# Patient Record
Sex: Male | Born: 1935
Health system: Southern US, Community
[De-identification: ages and names within clinical notes are randomized; demographics above are authoritative.]

## PROBLEM LIST (undated history)

## (undated) DIAGNOSIS — M545 Low back pain, unspecified: Secondary | ICD-10-CM

## (undated) DIAGNOSIS — R001 Bradycardia, unspecified: Secondary | ICD-10-CM

## (undated) DIAGNOSIS — K219 Gastro-esophageal reflux disease without esophagitis: Secondary | ICD-10-CM

## (undated) DIAGNOSIS — R0602 Shortness of breath: Secondary | ICD-10-CM

## (undated) DIAGNOSIS — G8929 Other chronic pain: Secondary | ICD-10-CM

## (undated) DIAGNOSIS — H409 Unspecified glaucoma: Secondary | ICD-10-CM

## (undated) DIAGNOSIS — I10 Essential (primary) hypertension: Secondary | ICD-10-CM

## (undated) DIAGNOSIS — T7840XA Allergy, unspecified, initial encounter: Secondary | ICD-10-CM

## (undated) DIAGNOSIS — N189 Chronic kidney disease, unspecified: Secondary | ICD-10-CM

## (undated) DIAGNOSIS — E785 Hyperlipidemia, unspecified: Secondary | ICD-10-CM

## (undated) DIAGNOSIS — M1990 Unspecified osteoarthritis, unspecified site: Secondary | ICD-10-CM

## (undated) HISTORY — DX: Chronic kidney disease, unspecified: N18.9

## (undated) HISTORY — DX: Essential (primary) hypertension: I10

## (undated) HISTORY — DX: Low back pain, unspecified: M54.50

## (undated) HISTORY — DX: Unspecified osteoarthritis, unspecified site: M19.90

## (undated) HISTORY — DX: Shortness of breath: R06.02

## (undated) HISTORY — DX: Bradycardia, unspecified: R00.1

## (undated) HISTORY — DX: Hyperlipidemia, unspecified: E78.5

## (undated) HISTORY — DX: Low back pain: M54.5

## (undated) HISTORY — DX: Unspecified glaucoma: H40.9

## (undated) HISTORY — DX: Gastro-esophageal reflux disease without esophagitis: K21.9

## (undated) HISTORY — DX: Allergy, unspecified, initial encounter: T78.40XA

## (undated) HISTORY — DX: Other chronic pain: G89.29

---

## 2008-10-10 HISTORY — PX: CATARACT EXTRACTION: SUR2

## 2015-12-10 ENCOUNTER — Ambulatory Visit (INDEPENDENT_AMBULATORY_CARE_PROVIDER_SITE_OTHER): Payer: Medicare Other | Admitting: Family Medicine

## 2015-12-10 ENCOUNTER — Encounter: Payer: Self-pay | Admitting: Family Medicine

## 2015-12-10 VITALS — BP 122/75 | HR 83 | Temp 97.9°F | Resp 17 | Ht 67.0 in | Wt 163.3 lb

## 2015-12-10 DIAGNOSIS — I10 Essential (primary) hypertension: Secondary | ICD-10-CM | POA: Diagnosis not present

## 2015-12-10 DIAGNOSIS — E785 Hyperlipidemia, unspecified: Secondary | ICD-10-CM

## 2015-12-10 DIAGNOSIS — Z7189 Other specified counseling: Secondary | ICD-10-CM

## 2015-12-10 DIAGNOSIS — Z7689 Persons encountering health services in other specified circumstances: Secondary | ICD-10-CM

## 2015-12-10 NOTE — Progress Notes (Signed)
Name: Brett Gill   MRN: [id number]    DOB: Jan 26, 1935   Date:12/10/2015       Progress Note  Subjective  Chief Complaint  Chief Complaint  Patient presents with  . Establish Care    NP    Hyperlipidemia This is a chronic problem. Condition status: Lab results from previous PCP not available. Pertinent negatives include no chest pain, leg pain, myalgias or shortness of breath. Current antihyperlipidemic treatment includes statins.  Hypertension This is a chronic problem. The problem is controlled. Pertinent negatives include no chest pain, headaches, palpitations or shortness of breath. Past treatments include ACE inhibitors. There are no compliance problems.  There is no history of kidney disease, CAD/MI or CVA.    Pt. Is here to establish care. Former patient of Dr. Thana Ates. Currently being seen at the [facility], records not available for review.    Past Medical History  Diagnosis Date  . Allergy   . Hypertension   . Hyperlipidemia   . Chronic low back pain     Present for 8-9 years  . GERD (gastroesophageal reflux disease)   . Arthritis   . Cataract   . Glaucoma     Follows at the [facility]    Past Surgical History  Procedure Laterality Date  . Cataract extraction Bilateral 2010    Family History  Problem Relation Age of Onset  . Alzheimer's disease Mother   . Alzheimer's disease Father   . Heart disease Father     Social History   Social History  . Marital Status: Single    Spouse Name: N/A  . Number of Children: N/A  . Years of Education: N/A   Occupational History  . Not on file.   Social History Main Topics  . Smoking status: Never Smoker   . Smokeless tobacco: Never Used  . Alcohol Use: No  . Drug Use: No  . Sexual Activity: No   Other Topics Concern  . Not on file   Social History Narrative  . No narrative on file     Current outpatient prescriptions:  .  brimonidine (ALPHAGAN) 0.2 % ophthalmic solution, Place 1 drop into both eyes 3  (three) times daily., Disp: , Rfl:  .  Cyanocobalamin (VITAMIN B 12 PO), Take 1 tablet by mouth daily., Disp: , Rfl:  .  dorzolamide-timolol (COSOPT) 22.3-6.8 MG/ML ophthalmic solution, , Disp: , Rfl:  .  latanoprost (XALATAN) 0.005 % ophthalmic solution, , Disp: , Rfl:  .  lisinopril (PRINIVIL,ZESTRIL) 20 MG tablet, Take 1 tablet by mouth daily., Disp: , Rfl:  .  meloxicam (MOBIC) 15 MG tablet, Take 1 tablet by mouth daily., Disp: , Rfl:  .  omeprazole (PRILOSEC) 20 MG capsule, Take 2 capsules by mouth daily., Disp: , Rfl:  .  oxyCODONE-acetaminophen (PERCOCET/ROXICET) 5-325 MG tablet, Take 1 tablet by mouth 3 (three) times daily as needed for severe pain., Disp: , Rfl:  .  pravastatin (PRAVACHOL) 80 MG tablet, Take 1 tablet by mouth daily., Disp: , Rfl:   No Known Allergies   Review of Systems  Respiratory: Negative for shortness of breath.   Cardiovascular: Negative for chest pain and palpitations.  Musculoskeletal: Negative for myalgias.  Neurological: Negative for headaches.     Objective  Filed Vitals:   12/10/15 1102  BP: 122/75  Pulse: 83  Temp: 97.9 F (36.6 C)  TempSrc: Oral  Resp: 17  Height: 5\' 7"  (1.702 m)  Weight: 163 lb 4.8 oz (74.072 kg)  SpO2: 95%  Physical Exam  Constitutional: He is oriented to person, place, and time and well-developed, well-nourished, and in no distress.  HENT:  Head: Normocephalic and atraumatic.  Cardiovascular: Normal rate and regular rhythm.   Pulmonary/Chest: Effort normal and breath sounds normal.  Neurological: He is alert and oriented to person, place, and time.  Nursing note and vitals reviewed.       Assessment & Plan  1. Essential hypertension Blood pressure well controlled. Continue lisinopril  2. Hyperlipidemia We will obtain FLP at patient's subsequent visit.  3. Encounter to establish care with new doctor We will request records from patient's physician at the [facility].   Brett Gill Asad A. Faylene Kurtz  [facility] 12/10/2015 11:39 AM

## 2015-12-11 DIAGNOSIS — Z7689 Persons encountering health services in other specified circumstances: Secondary | ICD-10-CM | POA: Insufficient documentation

## 2015-12-21 ENCOUNTER — Encounter: Payer: Self-pay | Admitting: Family Medicine

## 2015-12-25 ENCOUNTER — Ambulatory Visit (INDEPENDENT_AMBULATORY_CARE_PROVIDER_SITE_OTHER): Payer: Medicare Other | Admitting: Family Medicine

## 2015-12-25 ENCOUNTER — Encounter: Payer: Self-pay | Admitting: Family Medicine

## 2015-12-25 VITALS — BP 120/66 | HR 60 | Temp 98.6°F | Resp 16 | Ht 67.0 in | Wt 163.2 lb

## 2015-12-25 DIAGNOSIS — E559 Vitamin D deficiency, unspecified: Secondary | ICD-10-CM

## 2015-12-25 DIAGNOSIS — Z0000 Encounter for general adult medical examination without abnormal findings: Secondary | ICD-10-CM | POA: Diagnosis not present

## 2015-12-25 NOTE — Progress Notes (Signed)
Name: Brett Gill   MRN: [id number]    DOB: 1935/01/07   Date:12/25/2015       Progress Note  Subjective  Chief Complaint  Chief Complaint  Patient presents with  . Annual Exam    CPE    HPI  Pt. Is here for a Complete Physical Exam. He is doing well, no concerns today. He is only here because [facility] recommends getting a yearly physical.   Past Medical History  Diagnosis Date  . Allergy   . Hypertension   . Hyperlipidemia   . Chronic low back pain     Present for 8-9 years  . GERD (gastroesophageal reflux disease)   . Arthritis   . Cataract   . Glaucoma     Follows at the [facility]    Past Surgical History  Procedure Laterality Date  . Cataract extraction Bilateral 2010    Family History  Problem Relation Age of Onset  . Alzheimer's disease Mother   . Alzheimer's disease Father   . Heart disease Father     Social History   Social History  . Marital Status: Single    Spouse Name: N/A  . Number of Children: N/A  . Years of Education: N/A   Occupational History  . Not on file.   Social History Main Topics  . Smoking status: Never Smoker   . Smokeless tobacco: Never Used  . Alcohol Use: No  . Drug Use: No  . Sexual Activity: No   Other Topics Concern  . Not on file   Social History Narrative     Current outpatient prescriptions:  .  brimonidine (ALPHAGAN) 0.2 % ophthalmic solution, Place 1 drop into both eyes 3 (three) times daily., Disp: , Rfl:  .  Cyanocobalamin (VITAMIN B 12 PO), Take 1 tablet by mouth daily., Disp: , Rfl:  .  dorzolamide-timolol (COSOPT) 22.3-6.8 MG/ML ophthalmic solution, , Disp: , Rfl:  .  latanoprost (XALATAN) 0.005 % ophthalmic solution, , Disp: , Rfl:  .  lisinopril (PRINIVIL,ZESTRIL) 20 MG tablet, Take 1 tablet by mouth daily., Disp: , Rfl:  .  meloxicam (MOBIC) 15 MG tablet, Take 1 tablet by mouth daily., Disp: , Rfl:  .  omeprazole (PRILOSEC) 20 MG capsule, Take 2 capsules by mouth daily., Disp: , Rfl:  .   oxyCODONE-acetaminophen (PERCOCET/ROXICET) 5-325 MG tablet, Take 1 tablet by mouth 3 (three) times daily as needed for severe pain., Disp: , Rfl:  .  pravastatin (PRAVACHOL) 80 MG tablet, Take 1 tablet by mouth daily., Disp: , Rfl:   No Known Allergies   Review of Systems  Constitutional: Negative for fever, chills and malaise/fatigue.  HENT: Negative for congestion and sore throat.   Eyes: Negative for blurred vision (Has [place], seeing a doctor in [place]) and double vision.  Respiratory: Negative for cough and shortness of breath.   Cardiovascular: Negative for chest pain and leg swelling.  Gastrointestinal: Negative for heartburn, nausea and vomiting.  Genitourinary: Negative for dysuria, frequency and hematuria.  Musculoskeletal: Positive for back pain (back pain every now and then) and joint pain. Negative for myalgias.  Skin: Negative for rash.  Neurological: Negative for dizziness and headaches.  Psychiatric/Behavioral: Negative for depression. The patient is not nervous/anxious and does not have insomnia.       Objective  Filed Vitals:   12/25/15 1119  BP: 120/66  Pulse: 60  Temp: 98.6 F (37 C)  TempSrc: Oral  Resp: 16  Height: 5\' 7"  (1.702 m)  Weight:  163 lb 3.2 oz (74.027 kg)  SpO2: 96%    Physical Exam  Constitutional: He is oriented to person, place, and time and well-developed, well-nourished, and in no distress.  HENT:  Head: Normocephalic and atraumatic.  Mouth/Throat: No posterior oropharyngeal erythema.  Eyes: Pupils are equal, round, and reactive to light.  Cardiovascular: Normal rate and regular rhythm.   Murmur heard.  Systolic murmur is present with a grade of 2/6  Pulmonary/Chest: Effort normal and breath sounds normal.  Abdominal: Soft. Bowel sounds are normal.  Genitourinary:  Deferred on pt. Request.  Musculoskeletal: He exhibits no edema.  Neurological: He is alert and oriented to person, place, and time.  Skin: Skin is warm and dry.   Psychiatric: Mood, memory, affect and judgment normal.  Nursing note and vitals reviewed.    Assessment & Plan  1. Annual physical exam Age appropriate laboratory screenings. - CBC with Differential - Comprehensive Metabolic Panel (CMET) - Lipid Profile - TSH - Vitamin D (25 hydroxy) - PSA   Surya Schroeter Asad A. Faylene Kurtz [facility] 12/25/2015 11:37 AM

## 2015-12-28 DIAGNOSIS — Z0000 Encounter for general adult medical examination without abnormal findings: Secondary | ICD-10-CM | POA: Diagnosis not present

## 2015-12-29 LAB — CBC WITH DIFFERENTIAL/PLATELET
BASOS: 0 %
Basophils Absolute: 0 10*3/uL (ref 0.0–0.2)
EOS (ABSOLUTE): 0.1 10*3/uL (ref 0.0–0.4)
EOS: 2 %
HEMOGLOBIN: 14 g/dL (ref 12.6–17.7)
Hematocrit: 42.5 % (ref 37.5–51.0)
IMMATURE GRANS (ABS): 0 10*3/uL (ref 0.0–0.1)
IMMATURE GRANULOCYTES: 0 %
LYMPHS: 21 %
Lymphocytes Absolute: 1.3 10*3/uL (ref 0.7–3.1)
MCH: 31 pg (ref 26.6–33.0)
MCHC: 32.9 g/dL (ref 31.5–35.7)
MCV: 94 fL (ref 79–97)
MONOCYTES: 8 %
Monocytes Absolute: 0.5 10*3/uL (ref 0.1–0.9)
NEUTROS PCT: 69 %
Neutrophils Absolute: 4.3 10*3/uL (ref 1.4–7.0)
PLATELETS: 154 10*3/uL (ref 150–379)
RBC: 4.51 x10E6/uL (ref 4.14–5.80)
RDW: 13.7 % (ref 12.3–15.4)
WBC: 6.2 10*3/uL (ref 3.4–10.8)

## 2015-12-29 LAB — COMPREHENSIVE METABOLIC PANEL
A/G RATIO: 1.9 (ref 1.2–2.2)
ALT: 16 IU/L (ref 0–44)
AST: 16 IU/L (ref 0–40)
Albumin: 4.2 g/dL (ref 3.5–4.8)
Alkaline Phosphatase: 60 IU/L (ref 39–117)
BUN/Creatinine Ratio: 12 (ref 10–22)
BUN: 17 mg/dL (ref 8–27)
Bilirubin Total: 0.5 mg/dL (ref 0.0–1.2)
CALCIUM: 9.2 mg/dL (ref 8.6–10.2)
CO2: 22 mmol/L (ref 18–29)
Chloride: 104 mmol/L (ref 96–106)
Creatinine, Ser: 1.41 mg/dL — ABNORMAL HIGH (ref 0.76–1.27)
GFR, EST AFRICAN AMERICAN: 55 mL/min/{1.73_m2} — AB (ref >59)
GFR, EST NON AFRICAN AMERICAN: 47 mL/min/{1.73_m2} — AB (ref >59)
Globulin, Total: 2.2 g/dL (ref 1.5–4.5)
Glucose: 99 mg/dL (ref 65–99)
Potassium: 5.4 mmol/L — ABNORMAL HIGH (ref 3.5–5.2)
Sodium: 140 mmol/L (ref 134–144)
TOTAL PROTEIN: 6.4 g/dL (ref 6.0–8.5)

## 2015-12-29 LAB — LIPID PANEL
CHOLESTEROL TOTAL: 126 mg/dL (ref 100–199)
Chol/HDL Ratio: 3.5 ratio units (ref 0.0–5.0)
HDL: 36 mg/dL — AB (ref >39)
LDL Calculated: 68 mg/dL (ref 0–99)
Triglycerides: 112 mg/dL (ref 0–149)
VLDL CHOLESTEROL CAL: 22 mg/dL (ref 5–40)

## 2015-12-29 LAB — PSA: Prostate Specific Ag, Serum: 2.2 ng/mL (ref 0.0–4.0)

## 2015-12-29 LAB — TSH: TSH: 2.95 u[IU]/mL (ref 0.450–4.500)

## 2015-12-29 LAB — VITAMIN D 25 HYDROXY (VIT D DEFICIENCY, FRACTURES): Vit D, 25-Hydroxy: 16.5 ng/mL — ABNORMAL LOW (ref 30.0–100.0)

## 2015-12-31 DIAGNOSIS — E559 Vitamin D deficiency, unspecified: Secondary | ICD-10-CM | POA: Insufficient documentation

## 2015-12-31 MED ORDER — ERGOCALCIFEROL 1.25 MG (50000 UT) PO CAPS: 50000.0000 [IU] | ORAL_CAPSULE | ORAL | Status: DC

## 2015-12-31 NOTE — Addendum Note (Signed)
Addended bySherryll Burger, Girtha Kilgore A A on: 12/31/2015 11:46 AM   Modules accepted: Orders, SmartSet

## 2016-01-01 ENCOUNTER — Telehealth: Payer: Self-pay | Admitting: Family Medicine

## 2016-01-01 NOTE — Telephone Encounter (Signed)
Pt said that his ins is going to cost him to much for the vit D 50,000 THAT YOU PRESCRIBED TO HIM. WANTS TO KNOW WHAT HE CAN GET OVER THE COUNTER INSTEAD OF A RX  THAT WOULD MAYBE BE THE SAME THING. HE CAN NOT AFFORD A LOT OF THINGS.

## 2016-01-01 NOTE — Telephone Encounter (Signed)
Patient's vitamin D levels were fairly low, he will need a prescription strength vitamin D 50,000 units to start off. after his levels normalize he may continue on OTC vitamin D. Please advise patient to fill the prescription.

## 2016-01-01 NOTE — Telephone Encounter (Signed)
Routed to Dr. Shah for advice  

## 2016-01-04 ENCOUNTER — Telehealth: Payer: Self-pay | Admitting: Family Medicine

## 2016-01-04 NOTE — Telephone Encounter (Signed)
Requesting return call to discuss vitamin d 50,000. States is not on his insurance plan on [employer]. Requesting a different prescription to be written and possibly send to local pharmacy [facility]

## 2016-01-04 NOTE — Telephone Encounter (Signed)
[facility] [address]. Pt call back.

## 2016-01-05 NOTE — Telephone Encounter (Signed)
Pt called again wanting a call back about his medication.

## 2016-01-06 ENCOUNTER — Telehealth: Payer: Self-pay

## 2016-01-06 DIAGNOSIS — E559 Vitamin D deficiency, unspecified: Secondary | ICD-10-CM

## 2016-01-06 MED ORDER — ERGOCALCIFEROL 1.25 MG (50000 UT) PO CAPS: 50000.0000 [IU] | ORAL_CAPSULE | ORAL | Status: DC

## 2016-01-06 NOTE — Telephone Encounter (Signed)
Vitamin D3 50,000 units has been sent to [facility]

## 2016-01-06 NOTE — Telephone Encounter (Signed)
Vitamin D3 50,000 has ben reordered due to it was sent to wrong pharmacy and now has been corrected and sent to [facility]

## 2016-11-11 ENCOUNTER — Ambulatory Visit: Payer: Medicare Other | Admitting: Family Medicine

## 2016-11-15 ENCOUNTER — Ambulatory Visit: Payer: Medicare Other | Admitting: Family Medicine

## 2017-01-09 ENCOUNTER — Ambulatory Visit: Payer: Medicare Other

## 2017-01-09 ENCOUNTER — Ambulatory Visit (INDEPENDENT_AMBULATORY_CARE_PROVIDER_SITE_OTHER): Payer: Medicare Other | Admitting: Family Medicine

## 2017-01-09 VITALS — BP 142/58 | HR 60 | Temp 97.9°F | Ht 64.0 in | Wt 155.8 lb

## 2017-01-09 DIAGNOSIS — Z1211 Encounter for screening for malignant neoplasm of colon: Secondary | ICD-10-CM | POA: Diagnosis not present

## 2017-01-09 DIAGNOSIS — Z0000 Encounter for general adult medical examination without abnormal findings: Secondary | ICD-10-CM

## 2017-01-09 DIAGNOSIS — E559 Vitamin D deficiency, unspecified: Secondary | ICD-10-CM | POA: Diagnosis not present

## 2017-01-09 DIAGNOSIS — M545 Low back pain: Secondary | ICD-10-CM

## 2017-01-09 DIAGNOSIS — G8929 Other chronic pain: Secondary | ICD-10-CM

## 2017-01-09 DIAGNOSIS — N429 Disorder of prostate, unspecified: Secondary | ICD-10-CM | POA: Diagnosis not present

## 2017-01-09 DIAGNOSIS — I10 Essential (primary) hypertension: Secondary | ICD-10-CM | POA: Diagnosis not present

## 2017-01-09 NOTE — Progress Notes (Signed)
Name: Brett Gill   MRN: [id number]    DOB: 07/20/1935   Date:01/09/2017       Progress Note  Subjective  Chief Complaint  No chief complaint on file.   HPI  Pt. Presents for [employer]. His last colonoscopy was 6-7 years ago at the [place], no records available. PSA was obtained in March 2017.    Past Medical History:  Diagnosis Date  . Allergy   . Arthritis   . Cataract   . Chronic low back pain    Present for 8-9 years  . GERD (gastroesophageal reflux disease)   . Glaucoma    Follows at the [place]  . Hyperlipidemia   . Hypertension     Past Surgical History:  Procedure Laterality Date  . CATARACT EXTRACTION Bilateral 2010    Family History  Problem Relation Age of Onset  . Alzheimer's disease Mother   . Alzheimer's disease Father   . Heart disease Father     Social History   Social History  . Marital status: Single    Spouse name: N/A  . Number of children: N/A  . Years of education: N/A   Occupational History  . Not on file.   Social History Main Topics  . Smoking status: Former Smoker    Types: Cigarettes  . Smokeless tobacco: Never Used     Comment: quit in 1976  . Alcohol use No  . Drug use: No  . Sexual activity: No   Other Topics Concern  . Not on file   Social History Narrative  . No narrative on file     Current Outpatient Prescriptions:  .  brimonidine (ALPHAGAN) 0.2 % ophthalmic solution, Place 1 drop into both eyes 3 (three) times daily., Disp: , Rfl:  .  Cyanocobalamin (VITAMIN B 12 PO), Take 1 tablet by mouth daily. , Disp: , Rfl:  .  dorzolamide-timolol (COSOPT) 22.3-6.8 MG/ML ophthalmic solution, , Disp: , Rfl:  .  ergocalciferol (VITAMIN D2) 50000 units capsule, Take 1 capsule (50,000 Units total) by mouth once a week. (Patient not taking: Reported on 01/09/2017), Disp: 12 capsule, Rfl: 0 .  latanoprost (XALATAN) 0.005 % ophthalmic solution, , Disp: , Rfl:  .  lisinopril (PRINIVIL,ZESTRIL) 20 MG tablet, Take 1 tablet by  mouth daily., Disp: , Rfl:  .  meloxicam (MOBIC) 15 MG tablet, Take 1 tablet by mouth daily., Disp: , Rfl:  .  omeprazole (PRILOSEC) 20 MG capsule, Take 2 capsules by mouth daily., Disp: , Rfl:  .  oxyCODONE-acetaminophen (PERCOCET/ROXICET) 5-325 MG tablet, Take 1 tablet by mouth 2 (two) times daily as needed for severe pain. , Disp: , Rfl:  .  pravastatin (PRAVACHOL) 80 MG tablet, Take 1 tablet by mouth daily., Disp: , Rfl:   No Known Allergies   Review of Systems  Constitutional: Negative for chills, fever and malaise/fatigue.  HENT: Negative for congestion, hearing loss, sinus pain and sore throat.   Eyes: Negative for blurred vision (has glaucoma, being follwed at the [place]) and double vision.  Respiratory: Negative for cough, sputum production and shortness of breath.   Cardiovascular: Negative for chest pain and leg swelling.  Gastrointestinal: Negative for abdominal pain.  Genitourinary: Negative for dysuria and hematuria.  Musculoskeletal: Positive for back pain and joint pain (receives injections for right shoulder pain rotator cuff). Negative for myalgias and neck pain.  Neurological: Negative for dizziness and headaches.  Psychiatric/Behavioral: Negative for depression. The patient is not nervous/anxious and does not have insomnia.  Objective  There were no vitals filed for this visit.  Physical Exam  Constitutional: He is oriented to person, place, and time and well-developed, well-nourished, and in no distress.  HENT:  Head: Normocephalic and atraumatic.  Eyes: Pupils are equal, round, and reactive to light.  Neck: Neck supple.  Cardiovascular: Normal rate, regular rhythm and normal heart sounds.   No murmur heard. Pulmonary/Chest: Effort normal and breath sounds normal. He has no wheezes.  Abdominal: Soft. Bowel sounds are normal. There is no tenderness.  Genitourinary: Rectum normal and prostate normal. Rectal exam shows no tenderness. Prostate is not enlarged and  not tender.  Musculoskeletal:       Lumbar back: He exhibits tenderness, pain and spasm.       Back:  Neurological: He is alert and oriented to person, place, and time.  Skin: Skin is warm, dry and intact.  Psychiatric: Mood, memory, affect and judgment normal.  Nursing note and vitals reviewed.       Assessment & Plan  1. Encounter for Medicare annual wellness exam Obtain age appropriate lab work for screening - CBC with Differential/Platelet - TSH - VITAMIN D 25 Hydroxy (Vit-D Deficiency, Fractures) - PSA - COMPLETE METABOLIC PANEL WITH GFR  2. Screening for colon cancer  - Cologuard  3. Chronic bilateral low back pain without sciatica Patient has been seen by the provider at the [facility], who has reportedly been tapering down his opioid therapy for chronic low back pain. Patient requesting a referral to a pain management provider to be able to explore further options for chronic pain. Referral to pain clinic - Ambulatory referral to Pain Clinic   [facility] A. Faylene Kurtz [facility] 01/09/2017 10:50 AM

## 2017-01-09 NOTE — Progress Notes (Signed)
Subjective:   Brett Gill is a 82 y.o. male who presents for Medicare Annual/Subsequent preventive examination.  Review of Systems:  N/A Cardiac Risk Factors include: advanced age (>23men, >42 women);dyslipidemia;hypertension;male gender     Objective:    Vitals: BP (!) 142/58 (BP Location: Left Arm)   Pulse 60   Temp 97.9 F (36.6 C) (Oral)   Ht 5\' 4"  (1.626 m)   Wt 155 lb 12.8 oz (70.7 kg)   BMI 26.74 kg/m   Body mass index is 26.74 kg/m.  Tobacco History  Smoking Status  . Former Smoker  . Types: Cigarettes  Smokeless Tobacco  . Never Used    Comment: quit in 1976     Counseling given: Not Answered   Past Medical History:  Diagnosis Date  . Allergy   . Arthritis   . Cataract   . Chronic low back pain    Present for 8-9 years  . GERD (gastroesophageal reflux disease)   . Glaucoma    Follows at the [facility]  . Hyperlipidemia   . Hypertension    Past Surgical History:  Procedure Laterality Date  . CATARACT EXTRACTION Bilateral 2010   Family History  Problem Relation Age of Onset  . Alzheimer's disease Mother   . Alzheimer's disease Father   . Heart disease Father    History  Sexual Activity  . Sexual activity: No    Outpatient Encounter Prescriptions as of 01/09/2017  Medication Sig  . brimonidine (ALPHAGAN) 0.2 % ophthalmic solution Place 1 drop into both eyes 3 (three) times daily.  . Cyanocobalamin (VITAMIN B 12 PO) Take 1 tablet by mouth daily.   . dorzolamide-timolol (COSOPT) 22.3-6.8 MG/ML ophthalmic solution   . latanoprost (XALATAN) 0.005 % ophthalmic solution   . lisinopril (PRINIVIL,ZESTRIL) 20 MG tablet Take 1 tablet by mouth daily.  . meloxicam (MOBIC) 15 MG tablet Take 1 tablet by mouth daily.  03/11/2017 omeprazole (PRILOSEC) 20 MG capsule Take 2 capsules by mouth daily.  Marland Kitchen oxyCODONE-acetaminophen (PERCOCET/ROXICET) 5-325 MG tablet Take 1 tablet by mouth 2 (two) times daily as needed for severe pain.   . pravastatin (PRAVACHOL) 80 MG tablet  Take 1 tablet by mouth daily.  . ergocalciferol (VITAMIN D2) 50000 units capsule Take 1 capsule (50,000 Units total) by mouth once a week. (Patient not taking: Reported on 01/09/2017)   No facility-administered encounter medications on file as of 01/09/2017.     Activities of Daily Living In your present state of health, do you have any difficulty performing the following activities: 01/09/2017  Hearing? N  Vision? N  Difficulty concentrating or making decisions? N  Walking or climbing stairs? N  Dressing or bathing? N  Doing errands, shopping? N  Preparing Food and eating ? N  Using the Toilet? N  In the past six months, have you accidently leaked urine? N  Do you have problems with loss of bowel control? N  Managing your Medications? N  Managing your Finances? N  Housekeeping or managing your Housekeeping? N  Some recent data might be hidden    Patient Care Team: 03/11/2017, MD as PCP - General (Family Medicine) Ellyn Hack, PA as Consulting Physician   Assessment:     Exercise Activities and Dietary recommendations Current Exercise Habits: Home exercise routine (and yardwork), Type of exercise: walking (walks dog), Time (Minutes): > 60, Frequency (Times/Week): 7, Weekly Exercise (Minutes/Week): 0  Goals    . Increase water intake  Recommend increasing water intake to 3-4 glasses a day.      Fall Risk Fall Risk  01/09/2017 12/25/2015 12/10/2015  Falls in the past year? No No No   Depression Screen PHQ 2/9 Scores 01/09/2017 12/25/2015 12/10/2015  PHQ - 2 Score 0 0 0    Cognitive Function     6CIT Screen 01/09/2017  What Year? 0 points  What month? 0 points  What time? 0 points  Count back from 20 0 points  Months in reverse 0 points  Repeat phrase 0 points  Total Score 0    Immunization History  Administered Date(s) Administered  . Influenza-Unspecified 08/11/2015   Screening Tests Health Maintenance  Topic Date Due  . PNA vac Low Risk Adult (1 of 2  - PCV13) 10/10/2026 (Originally 02/11/2002)  . TETANUS/TDAP  12/09/2026 (Originally 02/12/1954)  . INFLUENZA VACCINE  05/10/2017      Plan:  I have personally reviewed and addressed the Medicare Annual Wellness questionnaire and have noted the following in the patient's chart:  A. Medical and social history B. Use of alcohol, tobacco or illicit drugs  C. Current medications and supplements D. Functional ability and status E.  Nutritional status F.  Physical activity G. Advance directives H. List of other physicians I.  Hospitalizations, surgeries, and ER visits in previous 12 months J.  Vitals K. Screenings such as hearing and vision if needed, cognitive and depression L. Referrals and appointments - none  In addition, I have reviewed and discussed with patient certain preventive protocols, quality metrics, and best practice recommendations. A written personalized care plan for preventive services as well as general preventive health recommendations were provided to patient.  See attached scanned questionnaire for additional information.   Signed,  Hyacinth Meeker, LPN Nurse Health Advisor   MD Recommendations: None. Pt declined tetanus and pneumonia vaccine today. Pt is going to get his immunization record from the [place] and give [place] a copy to update. Pt thinks he has already had these vaccines.  I, as supervising physician, have reviewed the nurse health advisor's Medicare Wellness Visit note for this patient and concur with the findings and recommendations listed above.  Signed Syed Asad A. Sherryll Burger MD Attending Physician.

## 2017-01-09 NOTE — Patient Instructions (Signed)
Brett Gill , Thank you for taking time to come for your Medicare Wellness Visit. I appreciate your ongoing commitment to your health goals. Please review the following plan we discussed and let me know if I can assist you in the future.   Screening recommendations/referrals: Recommended yearly ophthalmology/optometry visit for glaucoma screening and checkup Recommended yearly dental visit for hygiene and checkup  Vaccinations: Influenza vaccine: Declined Pneumococcal vaccine: Pt to check records and update office Tdap vaccine: Declined  Advanced directives: Declined  Next appointment: None  Preventive Care 65 Years and Older, Male Preventive care refers to lifestyle choices and visits with your health care provider that can promote health and wellness. What does preventive care include?  A yearly physical exam. This is also called an annual well check.  Dental exams once or twice a year.  Routine eye exams. Ask your health care provider how often you should have your eyes checked.  Personal lifestyle choices, including:  Daily care of your teeth and gums.  Regular physical activity.  Eating a healthy diet.  Avoiding tobacco and drug use.  Limiting alcohol use.  Practicing safe sex.  Taking low doses of aspirin every day.  Taking vitamin and mineral supplements as recommended by your health care provider. What happens during an annual well check? The services and screenings done by your health care provider during your annual well check will depend on your age, overall health, lifestyle risk factors, and family history of disease. Counseling  Your health care provider may ask you questions about your:  Alcohol use.  Tobacco use.  Drug use.  Emotional well-being.  Home and relationship well-being.  Sexual activity.  Eating habits.  History of falls.  Memory and ability to understand (cognition).  Work and work Astronomer. Screening  You may have the  following tests or measurements:  Height, weight, and BMI.  Blood pressure.  Lipid and cholesterol levels. These may be checked every 5 years, or more frequently if you are over 38 years old.  Skin check.  Lung cancer screening. You may have this screening every year starting at age 75 if you have a 30-pack-year history of smoking and currently smoke or have quit within the past 15 years.  Fecal occult blood test (FOBT) of the stool. You may have this test every year starting at age 9.  Flexible sigmoidoscopy or colonoscopy. You may have a sigmoidoscopy every 5 years or a colonoscopy every 10 years starting at age 79.  Prostate cancer screening. Recommendations will vary depending on your family history and other risks.  Hepatitis C blood test.  Hepatitis B blood test.  Sexually transmitted disease (STD) testing.  Diabetes screening. This is done by checking your blood sugar (glucose) after you have not eaten for a while (fasting). You may have this done every 1-3 years.  Abdominal aortic aneurysm (AAA) screening. You may need this if you are a current or former smoker.  Osteoporosis. You may be screened starting at age 13 if you are at high risk. Talk with your health care provider about your test results, treatment options, and if necessary, the need for more tests. Vaccines  Your health care provider may recommend certain vaccines, such as:  Influenza vaccine. This is recommended every year.  Tetanus, diphtheria, and acellular pertussis (Tdap, Td) vaccine. You may need a Td booster every 10 years.  Zoster vaccine. You may need this after age 54.  Pneumococcal 13-valent conjugate (PCV13) vaccine. One dose is recommended after age 7.  Pneumococcal polysaccharide (PPSV23) vaccine. One dose is recommended after age 77. Talk to your health care provider about which screenings and vaccines you need and how often you need them. This information is not intended to replace  advice given to you by your health care provider. Make sure you discuss any questions you have with your health care provider. Document Released: 10/23/2015 Document Revised: 06/15/2016 Document Reviewed: 07/28/2015 Elsevier Interactive Patient Education  2017 [place] Prevention in the Home Falls can cause injuries. They can happen to people of all ages. There are many things you can do to make your home safe and to help prevent falls. What can I do on the outside of my home?  Regularly fix the edges of walkways and driveways and fix any cracks.  Remove anything that might make you trip as you walk through a door, such as a raised step or threshold.  Trim any bushes or trees on the path to your home.  Use bright outdoor lighting.  Clear any walking paths of anything that might make someone trip, such as rocks or tools.  Regularly check to see if handrails are loose or broken. Make sure that both sides of any steps have handrails.  Any raised decks and porches should have guardrails on the edges.  Have any leaves, snow, or ice cleared regularly.  Use sand or salt on walking paths during winter.  Clean up any spills in your garage right away. This includes oil or grease spills. What can I do in the bathroom?  Use night lights.  Install grab bars by the toilet and in the tub and shower. Do not use towel bars as grab bars.  Use non-skid mats or decals in the tub or shower.  If you need to sit down in the shower, use a plastic, non-slip stool.  Keep the floor dry. Clean up any water that spills on the floor as soon as it happens.  Remove soap buildup in the tub or shower regularly.  Attach bath mats securely with double-sided non-slip rug tape.  Do not have throw rugs and other things on the floor that can make you trip. What can I do in the bedroom?  Use night lights.  Make sure that you have a light by your bed that is easy to reach.  Do not use any sheets  or blankets that are too big for your bed. They should not hang down onto the floor.  Have a firm chair that has side arms. You can use this for support while you get dressed.  Do not have throw rugs and other things on the floor that can make you trip. What can I do in the kitchen?  Clean up any spills right away.  Avoid walking on wet floors.  Keep items that you use a lot in easy-to-reach places.  If you need to reach something above you, use a strong step stool that has a grab bar.  Keep electrical cords out of the way.  Do not use floor polish or wax that makes floors slippery. If you must use wax, use non-skid floor wax.  Do not have throw rugs and other things on the floor that can make you trip. What can I do with my stairs?  Do not leave any items on the stairs.  Make sure that there are handrails on both sides of the stairs and use them. Fix handrails that are broken or loose. Make sure that handrails are as  long as the stairways.  Check any carpeting to make sure that it is firmly attached to the stairs. Fix any carpet that is loose or worn.  Avoid having throw rugs at the top or bottom of the stairs. If you do have throw rugs, attach them to the floor with carpet tape.  Make sure that you have a light switch at the top of the stairs and the bottom of the stairs. If you do not have them, ask someone to add them for you. What else can I do to help prevent falls?  Wear shoes that:  Do not have high heels.  Have rubber bottoms.  Are comfortable and fit you well.  Are closed at the toe. Do not wear sandals.  If you use a stepladder:  Make sure that it is fully opened. Do not climb a closed stepladder.  Make sure that both sides of the stepladder are locked into place.  Ask someone to hold it for you, if possible.  Clearly mark and make sure that you can see:  Any grab bars or handrails.  First and last steps.  Where the edge of each step is.  Use  tools that help you move around (mobility aids) if they are needed. These include:  Canes.  Walkers.  Scooters.  Crutches.  Turn on the lights when you go into a dark area. Replace any light bulbs as soon as they burn out.  Set up your furniture so you have a clear path. Avoid moving your furniture around.  If any of your floors are uneven, fix them.  If there are any pets around you, be aware of where they are.  Review your medicines with your doctor. Some medicines can make you feel dizzy. This can increase your chance of falling. Ask your doctor what other things that you can do to help prevent falls. This information is not intended to replace advice given to you by your health care provider. Make sure you discuss any questions you have with your health care provider. Document Released: 07/23/2009 Document Revised: 03/03/2016 Document Reviewed: 10/31/2014 Elsevier Interactive Patient Education  2017 [employer].

## 2017-01-10 ENCOUNTER — Telehealth: Payer: Self-pay | Admitting: *Deleted

## 2017-01-10 LAB — COMPLETE METABOLIC PANEL WITH GFR
ALBUMIN: 3.9 g/dL (ref 3.6–5.1)
ALK PHOS: 47 U/L (ref 40–115)
ALT: 14 U/L (ref 9–46)
AST: 14 U/L (ref 10–35)
BILIRUBIN TOTAL: 0.8 mg/dL (ref 0.2–1.2)
BUN: 22 mg/dL (ref 7–25)
CO2: 24 mmol/L (ref 20–31)
Calcium: 8.9 mg/dL (ref 8.6–10.3)
Chloride: 108 mmol/L (ref 98–110)
Creat: 1.42 mg/dL — ABNORMAL HIGH (ref 0.70–1.18)
GFR, EST NON AFRICAN AMERICAN: 47 mL/min — AB (ref >=60)
GFR, Est African American: 54 mL/min — ABNORMAL LOW (ref >=60)
GLUCOSE: 94 mg/dL (ref 65–99)
POTASSIUM: 4.6 mmol/L (ref 3.5–5.3)
SODIUM: 139 mmol/L (ref 135–146)
TOTAL PROTEIN: 6 g/dL — AB (ref 6.1–8.1)

## 2017-01-10 LAB — CBC WITH DIFFERENTIAL/PLATELET
Basophils Absolute: 0 cells/uL (ref 0–200)
Basophils Relative: 0 %
EOS ABS: 104 {cells}/uL (ref 15–500)
EOS PCT: 2 %
HCT: 40 % (ref 38.5–50.0)
Hemoglobin: 13.2 g/dL (ref 13.2–17.1)
Lymphocytes Relative: 21 %
Lymphs Abs: 1092 cells/uL (ref 850–3900)
MCH: 30.8 pg (ref 27.0–33.0)
MCHC: 33 g/dL (ref 32.0–36.0)
MCV: 93.5 fL (ref 80.0–100.0)
MONOS PCT: 6 %
MPV: 9.9 fL (ref 7.5–12.5)
Monocytes Absolute: 312 cells/uL (ref 200–950)
NEUTROS ABS: 3692 {cells}/uL (ref 1500–7800)
NEUTROS PCT: 71 %
PLATELETS: 139 10*3/uL — AB (ref 140–400)
RBC: 4.28 MIL/uL (ref 4.20–5.80)
RDW: 13.8 % (ref 11.0–15.0)
WBC: 5.2 10*3/uL (ref 3.8–10.8)

## 2017-01-10 LAB — TSH: TSH: 2.09 m[IU]/L (ref 0.40–4.50)

## 2017-01-11 LAB — PSA: PSA: 1.7 ng/mL (ref <=4.0)

## 2017-01-11 LAB — VITAMIN D 25 HYDROXY (VIT D DEFICIENCY, FRACTURES): Vit D, 25-Hydroxy: 21 ng/mL — ABNORMAL LOW (ref 30–100)

## 2017-01-13 ENCOUNTER — Telehealth: Payer: Self-pay

## 2017-01-13 DIAGNOSIS — E559 Vitamin D deficiency, unspecified: Secondary | ICD-10-CM

## 2017-01-13 MED ORDER — ERGOCALCIFEROL 1.25 MG (50000 UT) PO CAPS: 50000.0000 [IU] | ORAL_CAPSULE | ORAL | 0 refills | Status: DC

## 2017-01-13 NOTE — Telephone Encounter (Signed)
Patient has been notified of lab results and a prescription for vitamin D3 50,000 units take 1 capsule once a week x12 weeks has been sent to [facility] per Dr. Sherryll Burger, patient has been notified

## 2017-01-30 ENCOUNTER — Telehealth: Payer: Self-pay | Admitting: Family Medicine

## 2017-01-30 NOTE — Telephone Encounter (Signed)
PT SAID THAT IT HAS BEEN 3 WKS SINCE HE WAS TO RECEIVE HIS COLOGURAD KIT AND HE HAS NOT YET. PLEASE ADVISE. °

## 2017-01-31 NOTE — Telephone Encounter (Signed)
Will check with company and notify patient

## 2017-02-02 ENCOUNTER — Ambulatory Visit: Payer: Medicare Other | Admitting: Pain Medicine

## 2017-02-06 DIAGNOSIS — Z1211 Encounter for screening for malignant neoplasm of colon: Secondary | ICD-10-CM | POA: Diagnosis not present

## 2017-02-06 DIAGNOSIS — Z1212 Encounter for screening for malignant neoplasm of rectum: Secondary | ICD-10-CM | POA: Diagnosis not present

## 2017-02-10 LAB — COLOGUARD: Cologuard: NEGATIVE

## 2017-03-16 ENCOUNTER — Ambulatory Visit: Payer: Medicare Other | Admitting: Pain Medicine

## 2017-03-21 ENCOUNTER — Encounter: Payer: Self-pay | Admitting: Family Medicine

## 2017-03-21 ENCOUNTER — Telehealth: Payer: Self-pay | Admitting: Family Medicine

## 2017-03-21 NOTE — Telephone Encounter (Signed)
Pt requesting return call. Checking status on colorguard results.

## 2017-03-21 NOTE — Telephone Encounter (Signed)
Spoke with Tim, rep from [employer], and he said that the results were negative.  He said that this information was faxed to our office last month. I asked him to refax it so that it could be documented.  I then returned this patient's call and after he verified his date of birth, negative results were reviewed.   Patient was pleased and said thanks.

## 2017-03-23 ENCOUNTER — Encounter: Payer: Self-pay | Admitting: Nurse Practitioner

## 2017-03-23 ENCOUNTER — Ambulatory Visit: Payer: Medicare Other | Attending: Nurse Practitioner | Admitting: Nurse Practitioner

## 2017-03-23 ENCOUNTER — Other Ambulatory Visit: Payer: Self-pay | Admitting: Nurse Practitioner

## 2017-03-23 VITALS — BP 153/70 | HR 56 | Temp 98.8°F | Resp 16 | Ht 68.0 in | Wt 155.0 lb

## 2017-03-23 DIAGNOSIS — G894 Chronic pain syndrome: Secondary | ICD-10-CM | POA: Diagnosis not present

## 2017-03-23 DIAGNOSIS — K219 Gastro-esophageal reflux disease without esophagitis: Secondary | ICD-10-CM | POA: Diagnosis not present

## 2017-03-23 DIAGNOSIS — E785 Hyperlipidemia, unspecified: Secondary | ICD-10-CM | POA: Insufficient documentation

## 2017-03-23 DIAGNOSIS — Z79891 Long term (current) use of opiate analgesic: Secondary | ICD-10-CM | POA: Insufficient documentation

## 2017-03-23 DIAGNOSIS — M25511 Pain in right shoulder: Secondary | ICD-10-CM

## 2017-03-23 DIAGNOSIS — G8929 Other chronic pain: Secondary | ICD-10-CM | POA: Diagnosis not present

## 2017-03-23 DIAGNOSIS — Z87891 Personal history of nicotine dependence: Secondary | ICD-10-CM | POA: Diagnosis not present

## 2017-03-23 DIAGNOSIS — Z79899 Other long term (current) drug therapy: Secondary | ICD-10-CM | POA: Diagnosis not present

## 2017-03-23 DIAGNOSIS — I10 Essential (primary) hypertension: Secondary | ICD-10-CM | POA: Insufficient documentation

## 2017-03-23 DIAGNOSIS — M545 Low back pain: Secondary | ICD-10-CM | POA: Insufficient documentation

## 2017-03-23 DIAGNOSIS — Z1211 Encounter for screening for malignant neoplasm of colon: Secondary | ICD-10-CM | POA: Insufficient documentation

## 2017-03-23 DIAGNOSIS — F1190 Opioid use, unspecified, uncomplicated: Secondary | ICD-10-CM

## 2017-03-23 DIAGNOSIS — E559 Vitamin D deficiency, unspecified: Secondary | ICD-10-CM | POA: Diagnosis not present

## 2017-03-23 DIAGNOSIS — M069 Rheumatoid arthritis, unspecified: Secondary | ICD-10-CM | POA: Insufficient documentation

## 2017-03-23 DIAGNOSIS — Z0000 Encounter for general adult medical examination without abnormal findings: Secondary | ICD-10-CM | POA: Insufficient documentation

## 2017-03-23 NOTE — Progress Notes (Signed)
Safety precautions to be maintained throughout the outpatient stay will include: orient to surroundings, keep bed in low position, maintain call bell within reach at all times, provide assistance with transfer out of bed and ambulation.  

## 2017-03-23 NOTE — Progress Notes (Signed)
Patient's Name: Joandy Burget  MRN: [id number]  Referring Provider: Roselee Nova, MD  DOB: [birth year]  PCP: Roselee Nova, MD  DOS: 03/23/2017  Note by: Dionisio David NP  Service setting: Ambulatory outpatient  Specialty: Interventional Pain Management  Location: [facility] (AMB) Pain Management Facility    Patient type: New Patient    Primary Reason(s) for Visit: Initial Patient Evaluation CC: Back Pain (lower)  HPI  Mr. Sardo is a 82 y.o. year old, male patient, who comes today for an initial evaluation. He has Hypertension; Hyperlipidemia; Encounter to establish care with new doctor; Annual physical exam; Vitamin D deficiency; Encounter for Medicare annual wellness exam; Screening for colon cancer; Chronic low back pain; Long term current use of opiate analgesic; Chronic pain syndrome; Long term prescription opiate use; Opiate use; Chronic midline low back pain without sciatica; and Pain in joint, shoulder region on his problem list.. His primarily concern today is the Back Pain (lower)  Pain Assessment: Self-Reported Pain Score: 8 /10 Clinically the patient looks like a 4/10 Reported level is inconsistent with clinical observations. Information on the proper use of the pain scale provided to the patient today Pain Type: Chronic pain Pain Location: Back Pain Orientation: Right, Left, Lower Pain Descriptors / Indicators: Aching Pain Frequency: Constant  Onset and Duration: Date of onset: 2014 Cause of pain: Unknown Severity: NAS-11 on the average: 8/10 Timing: Not influenced by the time of the day Aggravating Factors: Bending, Climbing, Kneeling, Lifiting, Motion, Prolonged sitting, Prolonged standing, Walking, Walking uphill and Walking downhill Alleviating Factors: Lying down, Medications, Sleeping and Warm showers or baths Associated Problems: Night-time cramps, Numbness and Pain that wakes patient up Quality of Pain: Aching, Constant, Nagging, Sharp, Throbbing and  Uncomfortable Previous Examinations or Tests: CT scan, MRI scan and X-rays Previous Treatments: Narcotic medications  The patient comes into the clinics today for the first time for a chronic pain management evaluation. According to the patient his main area of pain is in his lower back. He admits the right is greater than the left but is mostly in the middle. He denies any numbness tingling or weakness always radicular symptoms. He denies any surgery, interventional procedures. He has had physical therapy in the past however he states he was in effective. He admits that he has had an MRI in the past however completed [employer].  His second area of pain is in his right shoulder. He has occasional numbness and tingling mostly at night. He is currently being treated at the [facility] for this. He admits that he gets interventional therapy every 90 days. He is already scheduled for another injection. He denies any recent physical therapy.  Today I took the time to provide the patient with information regarding this pain practice. The patient was informed that the practice is divided into two sections: an interventional pain management section, as well as a completely separate and distinct medication management section. I explained that there are procedure days for interventional therapies, and evaluation days for follow-ups and medication management. Because of the amount of documentation required during both, they are kept separated. This means that there is the possibility that he may be scheduled for a procedure on one day, and medication management the next. I have also informed him that because of staffing and facility limitations, this practice will no longer take patients for medication management only. To illustrate the reasons for this, I gave the patient the example of surgeons, and how inappropriate it would be  to refer a patient to his/her care, just to write for the post-surgical antibiotics on a  surgery done by a different surgeon.   Because interventional pain management is part of the board-certified specialty for the doctors, the patient was informed that joining this practice means that they are open to any and all interventional therapies. I made it clear that this does not mean that they will be forced to have any procedures done. What this means is that I believe interventional therapies to be essential part of the diagnosis and proper management of chronic pain conditions. Therefore, patients not interested in these interventional alternatives will be better served under the care of a different practitioner.  The patient was also made aware of my Comprehensive Pain Management Safety Guidelines where by joining this practice, they limit all of their nerve blocks and joint injections to those done by our practice, for as long as we are retained to manage their care. Historic Controlled Substance Pharmacotherapy Review  PMP and historical list of controlled substances: Oxycodone/acetaminophen 5/325 mg 3 times daily, hydrocodone/acetaminophen 10/325 mg 3 times daily, hydrocodone/acetaminophen 5/325 mg 3 times daily Highest opioid analgesic regimen found: Oxycodone/acetaminophen 5/325 mg 3 times daily Most recent opioid analgesic: Oxycodone/acetaminophen 5/325 mg twice daily Current opioid analgesics:Oxycodone/acetaminophen 5/325 mg twice daily  Highest recorded MME/day: 30 mg/day MME/day: 15 mg/day Medications: The patient did not bring the medication(s) to the appointment, as requested in our "New Patient Package" Pharmacodynamics: Desired effects: Analgesia: The patient reports >50% benefit. Reported improvement in function: The patient reports medication allows him to accomplish basic ADLs. Clinically meaningful improvement in function (CMIF): Sustained CMIF goals met Perceived effectiveness: Described as relatively effective, allowing for increase in activities of daily living  (ADL) Undesirable effects: Side-effects or Adverse reactions: None reported Historical Monitoring: The patient  reports that he does not use drugs. List of all UDS Test(s): No results found for: MDMA, COCAINSCRNUR, PCPSCRNUR, PCPQUANT, CANNABQUANT, THCU, [place] List of all Serum Drug Screening Test(s):  No results found for: AMPHSCRSER, BARBSCRSER, BENZOSCRSER, COCAINSCRSER, PCPSCRSER, PCPQUANT, THCSCRSER, CANNABQUANT, OPIATESCRSER, OXYSCRSER, PROPOXSCRSER Historical Background Evaluation: [place] PDMP: Six (6) year initial data search conducted.             [place] Department of public safety, offender search: Editor, commissioning Information) Non-contributory Risk Assessment Profile: Aberrant behavior: None observed or detected today Risk factors for fatal opioid overdose: None identified today Fatal overdose hazard ratio (HR): 1.32 for 20-49 MME/day Non-fatal overdose hazard ratio (HR): 1.44 for 20-49 MME/day Risk of opioid abuse or dependence: 0.7-3.0% with doses ? 36 MME/day and 6.1-26% with doses ? 120 MME/day. Substance use disorder (SUD) risk level: Pending results of Medical Psychology Evaluation for SUD Opioid risk tool (ORT) (Total Score): 0  ORT Scoring interpretation table:  Score <3 = Low Risk for SUD  Score between 4-7 = Moderate Risk for SUD  Score >8 = High Risk for Opioid Abuse   PHQ-2 Depression Scale:  Total score: 0  PHQ-2 Scoring interpretation table: (Score and probability of major depressive disorder)  Score 0 = No depression  Score 1 = 15.4% Probability  Score 2 = 21.1% Probability  Score 3 = 38.4% Probability  Score 4 = 45.5% Probability  Score 5 = 56.4% Probability  Score 6 = 78.6% Probability   PHQ-9 Depression Scale:  Total score: 0  PHQ-9 Scoring interpretation table:  Score 0-4 = No depression  Score 5-9 = Mild depression  Score 10-14 = Moderate depression  Score 15-19 = Moderately severe  depression  Score 20-27 = Severe depression (2.4 times higher risk of SUD and  2.89 times higher risk of overuse)   Pharmacologic Plan: Pending ordered tests and/or consults  Meds  The patient has a current medication list which includes the following prescription(s): brimonidine, cyanocobalamin, dorzolamide-timolol, latanoprost, lisinopril, meloxicam, omeprazole, oxycodone-acetaminophen, pravastatin, and ergocalciferol.  Current Outpatient Prescriptions on File Prior to Visit  Medication Sig  . brimonidine (ALPHAGAN) 0.2 % ophthalmic solution Place 1 drop into both eyes 3 (three) times daily.  . Cyanocobalamin (VITAMIN B 12 PO) Take 1 tablet by mouth daily.   . dorzolamide-timolol (COSOPT) 22.3-6.8 MG/ML ophthalmic solution   . latanoprost (XALATAN) 0.005 % ophthalmic solution   . lisinopril (PRINIVIL,ZESTRIL) 20 MG tablet Take 1 tablet by mouth daily.  . meloxicam (MOBIC) 15 MG tablet Take 1 tablet by mouth daily.  Marland Kitchen omeprazole (PRILOSEC) 20 MG capsule Take 2 capsules by mouth daily.  Marland Kitchen oxyCODONE-acetaminophen (PERCOCET/ROXICET) 5-325 MG tablet Take 1 tablet by mouth 2 (two) times daily as needed for severe pain.   . pravastatin (PRAVACHOL) 80 MG tablet Take 1 tablet by mouth daily.  . ergocalciferol (VITAMIN D2) 50000 units capsule Take 1 capsule (50,000 Units total) by mouth once a week. For 12 weeks (Patient not taking: Reported on 03/23/2017)   No current facility-administered medications on file prior to visit.    Imaging Review  Note: No new results found.        ROS  Cardiovascular History: Hypertension Pulmonary or Respiratory History: Snoring  Neurological History: Negative for epilepsy, stroke, urinary or fecal inontinence, spina bifida or tethered cord syndrome Review of Past Neurological Studies: No results found for this or any previous visit. Psychological-Psychiatric History: Negative for anxiety, depression, schizophrenia, bipolar disorders or suicidal ideations or attempts Gastrointestinal History: Reflux or heatburn Genitourinary History:  Negative for nephrolithiasis, hematuria, renal failure or chronic kidney disease Hematological History: Brusing easily Endocrine History: Negative for diabetes or thyroid disease Rheumatologic History: Rheumatoid arthritis Musculoskeletal History: Negative for myasthenia gravis, muscular dystrophy, multiple sclerosis or malignant hyperthermia Work History: Retired  Allergies  Mr. Schadt has No Known Allergies.  Laboratory Chemistry  Inflammation Markers Lab Results  Component Value Date   CRP 0.5 03/23/2017   ESRSEDRATE 2 03/23/2017   (CRP: Acute Phase) (ESR: Chronic Phase) Renal Function Markers Lab Results  Component Value Date   BUN 25 03/23/2017   CREATININE 1.51 (H) 03/23/2017   GFRAA 50 (L) 03/23/2017   GFRNONAA 43 (L) 03/23/2017   Hepatic Function Markers Lab Results  Component Value Date   AST 15 03/23/2017   ALT 20 03/23/2017   ALBUMIN 4.2 03/23/2017   ALKPHOS 60 03/23/2017   Electrolytes Lab Results  Component Value Date   NA 141 03/23/2017   K 4.8 03/23/2017   CL 105 03/23/2017   CALCIUM 9.1 03/23/2017   MG 2.0 03/23/2017   Neuropathy Markers Lab Results  Component Value Date   VITAMINB12 1,130 03/23/2017   Bone Pathology Markers Lab Results  Component Value Date   ALKPHOS 60 03/23/2017   VD25OH 21 (L) 01/09/2017   CALCIUM 9.1 03/23/2017   Coagulation Parameters Lab Results  Component Value Date   PLT 139 (L) 01/09/2017   Cardiovascular Markers Lab Results  Component Value Date   HGB 13.2 01/09/2017   HCT 40.0 01/09/2017   Note: Lab results reviewed.  [place]  Drug: Mr. Loiseau  reports that he does not use drugs. Alcohol:  reports that he does not drink alcohol. Tobacco:  reports  that he has quit smoking. His smoking use included Cigarettes. He has never used smokeless tobacco. Medical:  has a past medical history of Allergy; Arthritis; Chronic low back pain; GERD (gastroesophageal reflux disease); Glaucoma; Hyperlipidemia; and  Hypertension. Family: family history includes Alzheimer's disease in his father and mother; Heart disease in his father.  Past Surgical History:  Procedure Laterality Date  . CATARACT EXTRACTION Bilateral 2010   Active Ambulatory Problems    Diagnosis Date Noted  . Hypertension 12/10/2015  . Hyperlipidemia 12/10/2015  . Encounter to establish care with new doctor 12/11/2015  . Annual physical exam 12/25/2015  . Vitamin D deficiency 12/31/2015  . Encounter for Medicare annual wellness exam 01/09/2017  . Screening for colon cancer 01/09/2017  . Chronic low back pain 01/09/2017  . Long term current use of opiate analgesic 03/23/2017  . Chronic pain syndrome 03/23/2017  . Long term prescription opiate use 03/23/2017  . Opiate use 03/23/2017  . Chronic midline low back pain without sciatica 03/23/2017  . Pain in joint, shoulder region 03/23/2017   Resolved Ambulatory Problems    Diagnosis Date Noted  . No Resolved Ambulatory Problems   Past Medical History:  Diagnosis Date  . Allergy   . Arthritis   . Chronic low back pain   . GERD (gastroesophageal reflux disease)   . Glaucoma   . Hyperlipidemia   . Hypertension    Constitutional Exam  General appearance: Well nourished, well developed, and well hydrated. In no apparent acute distress Vitals:   03/23/17 1301  BP: (!) 153/70  Pulse: (!) 56  Resp: 16  Temp: 98.8 F (37.1 C)  TempSrc: Oral  SpO2: 98%  Weight: 155 lb (70.3 kg)  Height: _0  (1.727 m)   BMI Assessment: Estimated body mass index is 23.57 kg/m as calculated from the following:   Height as of this encounter: _1  (1.727 m).   Weight as of this encounter: 155 lb (70.3 kg).  BMI interpretation table: BMI level Category Range association with higher incidence of chronic pain  <18 kg/m2 Underweight   18.5-24.9 kg/m2 Ideal body weight   25-29.9 kg/m2 Overweight Increased incidence by 20%  30-34.9 kg/m2 Obese (Class I) Increased incidence by 68%   35-39.9 kg/m2 Severe obesity (Class II) Increased incidence by 136%  >40 kg/m2 Extreme obesity (Class III) Increased incidence by 254%   BMI Readings from Last 4 Encounters:  03/23/17 23.57 kg/m  01/09/17 26.74 kg/m  12/25/15 25.56 kg/m  12/10/15 25.58 kg/m   Wt Readings from Last 4 Encounters:  03/23/17 155 lb (70.3 kg)  01/09/17 155 lb 12.8 oz (70.7 kg)  12/25/15 163 lb 3.2 oz (74 kg)  12/10/15 163 lb 4.8 oz (74.1 kg)  Psych/Mental status: Alert, oriented x 3 (person, place, & time)       Eyes: PERLA Respiratory: No evidence of acute respiratory distress  Cervical Spine Exam  Inspection: No masses, redness, or swelling Alignment: Symmetrical Functional ROM: Unrestricted ROM      Stability: No instability detected Muscle strength & Tone: Functionally intact Sensory: Unimpaired Palpation: No palpable anomalies              Upper Extremity (UE) Exam    Side: Right upper extremity  Side: Left upper extremity  Inspection: No masses, redness, swelling, or asymmetry. No contractures  Inspection: No masses, redness, swelling, or asymmetry. No contractures  Functional ROM: Decreased ROM          Functional ROM: Unrestricted ROM  Muscle strength & Tone: Functionally intact  Muscle strength & Tone: Functionally intact  Sensory: Unimpaired  Sensory: Unimpaired  Palpation: Complains of area being tender to palpation              Palpation: No palpable anomalies              Specialized Test(s): Deferred         Specialized Test(s): Deferred          Thoracic Spine Exam  Inspection: No masses, redness, or swelling Alignment: Symmetrical Functional ROM: Unrestricted ROM Stability: No instability detected Sensory: Unimpaired Muscle strength & Tone: No palpable anomalies  Lumbar Spine Exam  Inspection: No masses, redness, or swelling Alignment: Symmetrical Functional ROM: Unrestricted ROM      Stability: No instability detected Muscle strength & Tone: Functionally  intact Sensory: Unimpaired Palpation: Complains of area being tender to palpation       Provocative Tests: Lumbar Hyperextension and rotation test: Positive bilaterally for facet joint pain. Patrick's Maneuver: evaluation deferred today                    Gait & Posture Assessment  Ambulation: Unassisted Gait: Relatively normal for age and body habitus Posture: WNL   Lower Extremity Exam    Side: Right lower extremity  Side: Left lower extremity  Inspection: No masses, redness, swelling, or asymmetry. No contractures  Inspection: No masses, redness, swelling, or asymmetry. No contractures  Functional ROM: Unrestricted ROM          Functional ROM: Unrestricted ROM          Muscle strength & Tone: Functionally intact  Muscle strength & Tone: Functionally intact  Sensory: Unimpaired  Sensory: Unimpaired  Palpation: No palpable anomalies  Palpation: No palpable anomalies   Assessment  Primary Diagnosis & Pertinent Problem List: The primary encounter diagnosis was Chronic midline low back pain without sciatica. Diagnoses of Pain in joint of right shoulder, Chronic pain syndrome, Long term prescription opiate use, Long term current use of opiate analgesic, and Opiate use were also pertinent to this visit.  Visit Diagnosis: 1. Chronic midline low back pain without sciatica   2. Pain in joint of right shoulder   3. Chronic pain syndrome   4. Long term prescription opiate use   5. Long term current use of opiate analgesic   6. Opiate use    Plan of Care  Initial treatment plan:  Please be advised that as per protocol, today's visit has been an evaluation only. We have not taken over the patient's controlled substance management.  Problem-specific plan: No problem-specific Assessment & Plan notes found for this encounter.  Ordered Lab-work, Procedure(s), Referral(s), & Consult(s): Orders Placed This Encounter  Procedures  . DG Lumbar Spine Complete W/Bend  . DG Shoulder Right  .  Compliance Drug Analysis, Ur  . Comprehensive metabolic panel  . C-reactive protein  . Magnesium  . Sedimentation rate  . Vitamin B12   Pharmacotherapy: Medications ordered:  No orders of the defined types were placed in this encounter.  Medications administered during this visit: Mr. Jarnigan had no medications administered during this visit.   Pharmacotherapy under consideration:  Opioid Analgesics: The patient was informed that there is no guarantee that he would be a candidate for opioid analgesics. The decision will be made following CDC guidelines. This decision will be based on the results of diagnostic studies, as well as Mr. Fearn risk profile. Patient states he will continue to get  his [facility] from [facility]. Membrane stabilizer: To be determined at a later time Muscle relaxant: To be determined at a later time NSAID: To be determined at a later time Other analgesic(s): To be determined at a later time   Interventional therapies under consideration: Mr. Fjeld was informed that there is no guarantee that he would be a candidate for interventional therapies. The decision will be based on the results of diagnostic studies, as well as Mr. Huttner risk profile.  Possible procedure(s): Diagnostic right shoulder steroid injection Diagnostic right suprascapular nerve block Diagnostic bilateral lumbar facet injection Bilateral lumbar radiofrequency ablation    Provider-requested follow-up: Return for 2nd Visit, w/ Dr. Dossie Arbour.  No future appointments.  Primary Care Physician: Roselee Nova, MD Location: [facility] Outpatient Pain Management Facility Note by:  Date: 03/23/2017; Time: 11:43 AM  Pain Score Disclaimer: We use the NRS-11 scale. This is a self-reported, subjective measurement of pain severity with only modest accuracy. It is used primarily to identify changes within a particular patient. It must be understood that outpatient pain scales are  significantly less accurate that those used for research, where they can be applied under ideal controlled circumstances with minimal exposure to variables. In reality, the score is likely to be a combination of pain intensity and pain affect, where pain affect describes the degree of emotional arousal or changes in action readiness caused by the sensory experience of pain. Factors such as social and work situation, setting, emotional state, anxiety levels, expectation, and prior pain experience may influence pain perception and show large inter-individual differences that may also be affected by time variables.  Patient instructions provided during this appointment: Patient Instructions  ___________________________________________________________________________________  Appointment Policy  It is our goal and responsibility to provide the medical community with assistance in the evaluation and management of patients with chronic pain. Unfortunately our resources are limited. Because we do not have an unlimited amount of time, or available appointments, we are required to closely monitor and manage their use. The following rules exist to maximize their use:  Patient's responsibilities: 1. Punctuality: You are required to be physically present and registered in our facility at least 30 minutes before your appointment. 2. Tardiness: The cutoff is your appointment time. If you have an appointment scheduled for 10:00 AM and you arrive at 10:01, you will be required to reschedule your appointment.  3. Plan ahead: Always assume that you will encounter traffic on your way in. Plan for it. If you are dependent on a driver, make sure they understand these rules and the need to arrive early. 4. Other appointments and responsibilities: Avoid scheduling any other appointments before or after your pain clinic appointments.  5. Be prepared: Write down everything that you need to discuss with your healthcare  provider and give this information to the admitting nurse. Write down the medications that you will need refilled. Bring your pills and bottles (even the empty ones), to all of your appointments, except for those where a procedure is scheduled. 6. No children or pets: Find someone to take care of them. It is not appropriate to bring them in. 7. Scheduling changes: We request "advanced notification" of any changes or cancellations. 8. Advanced notification: Defined as a time period of more than 24 hours prior to the originally scheduled appointment. This allows for the appointment to be offered to other patients. 9. Rescheduling: When a visit is rescheduled, it will require the cancellation of the original appointment. For this reason they  both fall within the category of "Cancellations".  10. Cancellations: They require advanced notification. Any cancellation less than 24 hours before the  appointment will be recorded as a "No Show". 11. No Show: Defined as an unkept appointment where the patient failed to notify or declare to the practice their intention or inability to keep the appointment.  Corrective process for repeat offenders:  1. Tardiness: Three (3) episodes of rescheduling due to late arrivals will be recorded as one (1) "No Show". 2. Cancellation or reschedule: Three (3) cancellations or rescheduling will be recorded as one (1) "No Show". 3. "No Shows": Three (3) "No Shows" within a 12 month period will result in discharge from the practice.  ____________________________________________________________________________________________   ____________________________________________________________________________________________  Medication Rules  Applies to: All patients receiving prescriptions (written or electronic).  Pharmacy of record: Pharmacy where electronic prescriptions will be sent. If written prescriptions are taken to a different pharmacy, please inform the nursing staff.  The pharmacy listed in the electronic medical record should be the one where you would like electronic prescriptions to be sent.  Prescription refills: Only during scheduled appointments. Applies to both, written and electronic prescriptions.  NOTE: The following applies primarily to controlled substances (Opioid Pain Medications)  Patient's responsibilities: 1. Pain Pills: Bring all pain pills to every appointment (except for procedure appointments). 2. Pill Bottles: Bring pills in original pharmacy bottle. Always bring newest bottle. Bring bottle, even if empty. 3. Medication refills: You are responsible for knowing and keeping track of what medications you need refilled. The day before your appointment, write a list of all prescriptions that need to be refilled. Bring that list to your appointment and give it to the admitting nurse. Prescriptions will be written only during appointments. If you forget a medication, it will not be "Called in", "Faxed", or "electronically sent". You will need to get another appointment to get these prescribed. 4. Prescription Accuracy: You are responsible for carefully inspecting your prescriptions before leaving our office. Have the discharge nurse carefully go over each prescription with you, before taking them home. Make sure that your name is accurately spelled, that your address is correct. Check the name and dose of your medication to make sure it is accurate. Check the number of pills, and the written instructions to make sure they are clear and accurate. Make sure that you are given enough medication to last until your next medication refill appointment. 5. Taking Medication: Take medication as prescribed. Never take more pills than instructed. Never take medication more frequently than prescribed. Taking less pills or less frequently is permitted and encouraged, when it comes to controlled substances (written prescriptions).  6. Inform other Doctors: Always  inform, all of your healthcare providers, of all the medications you take. 7. Pain Medication from other Providers: You are not allowed to accept any additional pain medication from any other Doctor or Healthcare provider. There are two exceptions to this rule. (see below) In the event that you require additional pain medication, you are responsible for notifying us, as stated below. 8. Medication Agreement: You are responsible for carefully reading and following our Medication Agreement. This must be signed before receiving any prescriptions from our practice. Safely store a copy of your signed Agreement. Violations to the Agreement will result in no further prescriptions. (Additional copies of our Medication Agreement are available upon request.) 9. Laws, Rules, & Regulations: All patients are expected to follow all Federal and [employer], Rules, [employer]. Ignorance of the Laws does  not constitute a valid excuse.  Exceptions: There are only two exceptions to the rule of not receiving pain medications from other Healthcare Providers. 1. Exception #1 (Emergencies): In the event of an emergency (i.e.: accident requiring emergency care), you are allowed to receive additional pain medication. However, you are responsible for: As soon as you are able, call our office [phone], at any time of the day or night, and leave a message stating your name, the date and nature of the emergency, and the name and dose of the medication prescribed. In the event that your call is answered by a member of our staff, make sure to document and save the date, time, and the name of the person that took your information.  2. Exception #2 (Planned Surgery): In the event that you are scheduled by another doctor or dentist to have any type of surgery or procedure, you are allowed (for a period no longer than 30 days), to receive additional pain medication, for the acute post-op pain. However, in this case, you are  responsible for picking up a copy of our "Post-op Pain Management for Surgeons" handout, and giving it to your surgeon or dentist. This document is available at our office, and does not require an appointment to obtain it. Simply go to our office during business hours (Monday-Thursday from 8:00 AM to 4:00 PM) (Friday 8:00 AM to 12:00 Noon) or if you have a scheduled appointment with [place], prior to your surgery, and ask for it by name. In addition, you will need to provide [place] with your name, name of your surgeon, type of surgery, and date of procedure or surgery.  ____________________________________________________________________________________________

## 2017-03-23 NOTE — Patient Instructions (Signed)
___________________________________________________________________________________  Appointment Policy  It is our goal and responsibility to provide the medical community with assistance in the evaluation and management of patients with chronic pain. Unfortunately our resources are limited. Because we do not have an unlimited amount of time, or available appointments, we are required to closely monitor and manage their use. The following rules exist to maximize their use:  Patient's responsibilities: 1. Punctuality: You are required to be physically present and registered in our facility at least 30 minutes before your appointment. 2. Tardiness: The cutoff is your appointment time. If you have an appointment scheduled for 10:00 AM and you arrive at 10:01, you will be required to reschedule your appointment.  3. Plan ahead: Always assume that you will encounter traffic on your way in. Plan for it. If you are dependent on a driver, make sure they understand these rules and the need to arrive early. 4. Other appointments and responsibilities: Avoid scheduling any other appointments before or after your pain clinic appointments.  5. Be prepared: Write down everything that you need to discuss with your healthcare provider and give this information to the admitting nurse. Write down the medications that you will need refilled. Bring your pills and bottles (even the empty ones), to all of your appointments, except for those where a procedure is scheduled. 6. No children or pets: Find someone to take care of them. It is not appropriate to bring them in. 7. Scheduling changes: We request "advanced notification" of any changes or cancellations. 8. Advanced notification: Defined as a time period of more than 24 hours prior to the originally scheduled appointment. This allows for the appointment to be offered to other patients. 9. Rescheduling: When a visit is rescheduled, it will require the cancellation of the  original appointment. For this reason they both fall within the category of "Cancellations".  10. Cancellations: They require advanced notification. Any cancellation less than 24 hours before the  appointment will be recorded as a "No Show". 11. No Show: Defined as an unkept appointment where the patient failed to notify or declare to the practice their intention or inability to keep the appointment.  Corrective process for repeat offenders:  1. Tardiness: Three (3) episodes of rescheduling due to late arrivals will be recorded as one (1) "No Show". 2. Cancellation or reschedule: Three (3) cancellations or rescheduling will be recorded as one (1) "No Show". 3. "No Shows": Three (3) "No Shows" within a 12 month period will result in discharge from the practice.  ____________________________________________________________________________________________   ____________________________________________________________________________________________  Medication Rules  Applies to: All patients receiving prescriptions (written or electronic).  Pharmacy of record: Pharmacy where electronic prescriptions will be sent. If written prescriptions are taken to a different pharmacy, please inform the nursing staff. The pharmacy listed in the electronic medical record should be the one where you would like electronic prescriptions to be sent.  Prescription refills: Only during scheduled appointments. Applies to both, written and electronic prescriptions.  NOTE: The following applies primarily to controlled substances (Opioid Pain Medications)  Patient's responsibilities: 1. Pain Pills: Bring all pain pills to every appointment (except for procedure appointments). 2. Pill Bottles: Bring pills in original pharmacy bottle. Always bring newest bottle. Bring bottle, even if empty. 3. Medication refills: You are responsible for knowing and keeping track of what medications you need refilled. The day before  your appointment, write a list of all prescriptions that need to be refilled. Bring that list to your appointment and give it to the  admitting nurse. Prescriptions will be written only during appointments. If you forget a medication, it will not be "Called in", "Faxed", or "electronically sent". You will need to get another appointment to get these prescribed. 4. Prescription Accuracy: You are responsible for carefully inspecting your prescriptions before leaving our office. Have the discharge nurse carefully go over each prescription with you, before taking them home. Make sure that your name is accurately spelled, that your address is correct. Check the name and dose of your medication to make sure it is accurate. Check the number of pills, and the written instructions to make sure they are clear and accurate. Make sure that you are given enough medication to last until your next medication refill appointment. 5. Taking Medication: Take medication as prescribed. Never take more pills than instructed. Never take medication more frequently than prescribed. Taking less pills or less frequently is permitted and encouraged, when it comes to controlled substances (written prescriptions).  6. Inform other Doctors: Always inform, all of your healthcare providers, of all the medications you take. 7. Pain Medication from other Providers: You are not allowed to accept any additional pain medication from any other Doctor or Healthcare provider. There are two exceptions to this rule. (see below) In the event that you require additional pain medication, you are responsible for notifying us, as stated below. 8. Medication Agreement: You are responsible for carefully reading and following our Medication Agreement. This must be signed before receiving any prescriptions from our practice. Safely store a copy of your signed Agreement. Violations to the Agreement will result in no further prescriptions. (Additional copies of our  Medication Agreement are available upon request.) 9. Laws, Rules, & Regulations: All patients are expected to follow all [address] and [employer], Rules, [employer]. Ignorance of the Laws does not constitute a valid excuse.  Exceptions: There are only two exceptions to the rule of not receiving pain medications from other Healthcare Providers. 1. Exception #1 (Emergencies): In the event of an emergency (i.e.: accident requiring emergency care), you are allowed to receive additional pain medication. However, you are responsible for: As soon as you are able, call our office [phone], at any time of the day or night, and leave a message stating your name, the date and nature of the emergency, and the name and dose of the medication prescribed. In the event that your call is answered by a member of our staff, make sure to document and save the date, time, and the name of the person that took your information.  2. Exception #2 (Planned Surgery): In the event that you are scheduled by another doctor or dentist to have any type of surgery or procedure, you are allowed (for a period no longer than 30 days), to receive additional pain medication, for the acute post-op pain. However, in this case, you are responsible for picking up a copy of our "Post-op Pain Management for Surgeons" handout, and giving it to your surgeon or dentist. This document is available at our office, and does not require an appointment to obtain it. Simply go to our office during business hours (Monday-Thursday from 8:00 AM to 4:00 PM) (Friday 8:00 AM to 12:00 Noon) or if you have a scheduled appointment with [place], prior to your surgery, and ask for it by name. In addition, you will need to provide [place] with your name, name of your surgeon, type of surgery, and date of procedure or surgery.  ____________________________________________________________________________________________

## 2017-03-24 ENCOUNTER — Ambulatory Visit
Admission: RE | Admit: 2017-03-24 | Discharge: 2017-03-24 | Disposition: A | Discharge status: Home / Self Care | Payer: Medicare Other | Source: Ambulatory Visit | Attending: Nurse Practitioner | Admitting: Nurse Practitioner

## 2017-03-24 ENCOUNTER — Ambulatory Visit
Admission: RE | Admit: 2017-03-24 | Discharge: 2017-03-24 | Disposition: A | Discharge status: Home / Self Care | Payer: Medicare Other | Source: Ambulatory Visit | Attending: Pain Medicine | Admitting: Pain Medicine

## 2017-03-24 ENCOUNTER — Telehealth: Payer: Self-pay | Admitting: Pain Medicine

## 2017-03-24 DIAGNOSIS — M545 Low back pain: Principal | ICD-10-CM

## 2017-03-24 DIAGNOSIS — G8929 Other chronic pain: Secondary | ICD-10-CM | POA: Insufficient documentation

## 2017-03-24 DIAGNOSIS — M5136 Other intervertebral disc degeneration, lumbar region: Secondary | ICD-10-CM | POA: Insufficient documentation

## 2017-03-24 DIAGNOSIS — I700 Atherosclerosis of aorta: Secondary | ICD-10-CM | POA: Diagnosis not present

## 2017-03-24 DIAGNOSIS — M19011 Primary osteoarthritis, right shoulder: Secondary | ICD-10-CM | POA: Insufficient documentation

## 2017-03-24 DIAGNOSIS — M25511 Pain in right shoulder: Secondary | ICD-10-CM | POA: Diagnosis not present

## 2017-03-24 LAB — SEDIMENTATION RATE: Sed Rate: 2 mm/hr (ref 0–30)

## 2017-03-24 LAB — COMPREHENSIVE METABOLIC PANEL
A/G RATIO: 2 (ref 1.2–2.2)
ALT: 20 IU/L (ref 0–44)
AST: 15 IU/L (ref 0–40)
Albumin: 4.2 g/dL (ref 3.5–4.7)
Alkaline Phosphatase: 60 IU/L (ref 39–117)
BUN/Creatinine Ratio: 17 (ref 10–24)
BUN: 25 mg/dL (ref 8–27)
Bilirubin Total: 0.4 mg/dL (ref 0.0–1.2)
CALCIUM: 9.1 mg/dL (ref 8.6–10.2)
CO2: 21 mmol/L (ref 20–29)
CREATININE: 1.51 mg/dL — AB (ref 0.76–1.27)
Chloride: 105 mmol/L (ref 96–106)
GFR calc Af Amer: 50 mL/min/{1.73_m2} — ABNORMAL LOW (ref >59)
GFR, EST NON AFRICAN AMERICAN: 43 mL/min/{1.73_m2} — AB (ref >59)
GLUCOSE: 88 mg/dL (ref 65–99)
Globulin, Total: 2.1 g/dL (ref 1.5–4.5)
Potassium: 4.8 mmol/L (ref 3.5–5.2)
Sodium: 141 mmol/L (ref 134–144)
TOTAL PROTEIN: 6.3 g/dL (ref 6.0–8.5)

## 2017-03-24 LAB — C-REACTIVE PROTEIN: CRP: 0.5 mg/L (ref 0.0–4.9)

## 2017-03-24 LAB — VITAMIN B12: Vitamin B-12: 1130 pg/mL (ref 232–1245)

## 2017-03-24 LAB — MAGNESIUM: MAGNESIUM: 2 mg/dL (ref 1.6–2.3)

## 2017-03-24 NOTE — Telephone Encounter (Signed)
Brett Gill states he wont be getting any medications from [place] and he doesn't need to have psych eval. Please verify with Crystal if this patient still needs appt with med pysch or if we can go ahead with next appt. Patient states concerns over cost as he is on fixed income. He says he can get things done at [facility] for free.

## 2017-03-24 NOTE — Telephone Encounter (Signed)
Spoke with Brett Gill to let them know that Crystal nor Dr Laban Emperor are in the office today and that we will check with them on Monday and let them know their response in whether he needs to be seen for med/psych evaluation.  We will be back in contact with him to let him know their answer.

## 2017-03-27 NOTE — Telephone Encounter (Signed)
Patient informed med/psych eval not necessary.

## 2017-03-29 LAB — COMPLIANCE DRUG ANALYSIS, UR

## 2017-04-03 NOTE — Progress Notes (Signed)
Results were reviewed and found to be: abnormal  No acute injury or pathology identified, Further testing may be needed if symptoms change  Review would suggest interventional pain management techniques may be of benefit

## 2017-04-03 NOTE — Progress Notes (Signed)
Results were reviewed and found to be: mildly abnormal  No acute injury or pathology identified  Review would suggest interventional pain management techniques may be of benefit 

## 2017-04-24 NOTE — Progress Notes (Signed)
Patient's Name: Brett Gill  MRN: [id number]  Referring Provider: Roselee Nova, MD  DOB: 1934/11/07  PCP: Roselee Nova, MD  DOS: 04/25/2017  Note by: Gaspar Cola, MD  Service setting: Ambulatory outpatient  Specialty: Interventional Pain Management  Location: [facility] (AMB) Pain Management Facility    Patient type: Established   Primary Reason(s) for Visit: Encounter for evaluation before starting new chronic pain management plan of care (Level of risk: moderate) CC: Back Pain (lower)  HPI  Brett Gill is a 82 y.o. year old, male patient, who comes today for a follow-up evaluation to review the test results and decide on a treatment plan. He has Hypertension; Hyperlipidemia; Encounter to establish care with new doctor; Annual physical exam; Vitamin D deficiency; Encounter for Medicare annual wellness exam; Screening for colon cancer; Long term current use of opiate analgesic; Chronic pain syndrome; Long term prescription opiate use; Opiate use; Chronic low back pain (Primary Area of Pain) (Bilateral) (L>R); Chronic shoulder pain (Secondary Area of pain) (Right); Lumbar facet syndrome (Bilateral) (L>R); Muscle cramps at night; Musculoskeletal pain; Neurogenic pain; Night muscle spasms; Rotator cuff arthropathy (Right); Osteoarthritis of shoulder (Right); and Osteoarthritis of AC (acromioclavicular) joint (Right) on his problem list. His primarily concern today is the Back Pain (lower)  Pain Assessment: Location: Lower, Right, Left Back Radiating: buttocks- has some cramping in legs and drinks pickle juice Duration: Chronic pain Quality: Aching, Cramping, Discomfort, Dull Severity: 6 /10 (self-reported pain score)  Note: Reported level is inconsistent with clinical observations. Clinically the patient looks like a 3/10 Information on the proper use of the pain scale provided to the patient today Effect on ADL: "i do what I have too"- "i go slowly"  cant sit long Timing:  Constant Modifying factors: medications- heat sometimes- streching-   Brett Gill comes in today for a follow-up visit after his initial evaluation on 03/24/2017. Today we went over the results of his tests. These were explained in "Layman's terms". During today's appointment we went over my diagnostic impression, as well as the proposed treatment plan.  According to the patient his main area of pain is in his lower back. He admits the right is greater than the left but is mostly in the middle. He denies any numbness tingling or weakness always radicular symptoms. He denies any surgery, interventional procedures. He has had physical therapy in the past however he states he was in effective. He admits that he has had an MRI in the past however completed [employer].  His second area of pain is in his right shoulder. He has occasional numbness and tingling mostly at night. He is currently being treated at the [facility] for this. He admits that he gets intra-articular shoulder injections every 90 days. He denies any recent physical therapy.  In considering the treatment plan options, Brett Gill was reminded that I no longer take patients for medication management only. Because of the cost some of the medications to the patient, he has indicated that he will continue to get those at the [facility]. I asked him to let me know if he had no intention of taking advantage of the interventional therapies, so that we could make arrangements to provide this space to someone interested. The patient indicated that he was interested in getting a "nerve block" to "permanently get rid of his pain". As soon as the patient said this, 82 it was clear to me that the patient was having unrealistic expectations and therefore I took  the time to explain that we would normally do some diagnostic nerve blocks first to determining the location and/or origin of the pain and as soon as that was determined, then we would examine the  possibility of providing him with longer lasting treatments such as radiofrequency. However I wanted to make it clear that are job is that of "pain management" and that we can rarely eliminate the patient's pain forever. After he realized that we could not provide him with any type of guarantee on the outcome and that in fact it was likely to be initially temporary and possibly requiring more than 1 injection, he decided that this was not the place for him and he left the clinic without attempting to get a follow-up appointment or collecting any of the information that we had gathered for him.  Further details on both, my assessment(s), as well as the proposed treatment plan, please see below.  Controlled Substance Pharmacotherapy Assessment REMS (Risk Evaluation and Mitigation Strategy)  Analgesic: Oxycodone/acetaminophen 5/325 mg twice daily  Highest recorded MME/day: 30 mg/day MME/day: 15 mg/day Pill Count: None expected due to no prior prescriptions written by our practice. No notes on file Pharmacokinetics: Liberation and absorption (onset of action): WNL Distribution (time to peak effect): WNL Metabolism and excretion (duration of action): WNL         Pharmacodynamics: Desired effects: Analgesia: Mr. Robling reports >50% benefit. Functional ability: Patient reports that medication allows him to accomplish basic ADLs Clinically meaningful improvement in function (CMIF): Sustained CMIF goals met Perceived effectiveness: Described as relatively effective, allowing for increase in activities of daily living (ADL) Undesirable effects: Side-effects or Adverse reactions: None reported Monitoring: [place] PMP: Online review of the past 16-monthperiod previously conducted. Not applicable at this point since we have not taken over the patient's medication management yet. List of all Serum Drug Screening Test(s):  No results found for: AMPHSCRSER, BARBSCRSER, BENZOSCRSER, COCAINSCRSER, PCPSCRSER,  THCSCRSER, OPIATESCRSER, OXYSCRSER, PKingstonList of all UDS test(s) done:  Lab Results  Component Value Date   SUMMARY FINAL 03/23/2017   Last UDS on record: Summary  Date Value Ref Range Status  03/23/2017 FINAL  Final    Comment:    ==================================================================== TOXASSURE COMP DRUG ANALYSIS,UR ==================================================================== Test                             Result       Flag       Units Drug Present and Declared for Prescription Verification   Oxycodone                      662          EXPECTED   ng/mg creat   Oxymorphone                    224          EXPECTED   ng/mg creat   Noroxycodone                   918          EXPECTED   ng/mg creat   Noroxymorphone                 82           EXPECTED   ng/mg creat    Sources of oxycodone are scheduled prescription medications.    Oxymorphone, noroxycodone, and noroxymorphone are  expected    metabolites of oxycodone. Oxymorphone is also available as a    scheduled prescription medication.   Acetaminophen                  PRESENT      EXPECTED ==================================================================== Test                      Result    Flag   Units      Ref Range   Creatinine              173              mg/dL      >=20 ==================================================================== Declared Medications:  The flagging and interpretation on this report are based on the  following declared medications.  Unexpected results may arise from  inaccuracies in the declared medications.  **Note: The testing scope of this panel includes these medications:  Oxycodone (Oxycodone Acetaminophen)  **Note: The testing scope of this panel does not include small to  moderate amounts of these reported medications:  Acetaminophen (Oxycodone Acetaminophen)  **Note: The testing scope of this panel does not include following  reported medications:   Brimonidine Tartrate  Cyanocobalamin  Dorzolamide  Latanoprost  Lisinopril  Meloxicam  Omeprazole  Pravastatin  Vitamin D2 (Ergocalciferol) ==================================================================== For clinical consultation, please call [phone]. ====================================================================    UDS interpretation: No unexpected findings.          Medication Assessment Form: Patient introduced to form today Treatment compliance: Treatment may start today if patient agrees with proposed plan. Evaluation of compliance is not applicable at this point Risk Assessment Profile: Aberrant behavior: See initial evaluations. None observed or detected today Comorbid factors increasing risk of overdose: See initial evaluation. No additional risks detected today Risk Mitigation Strategies:  Patient opioid safety counseling: Completed today. Counseling provided to patient as per "Patient Counseling Document". Document signed by patient, attesting to counseling and understanding Patient-Prescriber Agreement (PPA): Obtained today.  Controlled substance notification to other providers: Written and sent today.  Pharmacologic Plan: The patient has indicated that he is not interested in having [place] taking over his medication regimen as this will be done by the [facility].             Laboratory Chemistry  Inflammation Markers (CRP: Acute Phase) (ESR: Chronic Phase) Lab Results  Component Value Date   CRP 0.5 03/23/2017   ESRSEDRATE 2 03/23/2017                 Renal Function Markers Lab Results  Component Value Date   BUN 25 03/23/2017   CREATININE 1.51 (H) 03/23/2017   GFRAA 50 (L) 03/23/2017   GFRNONAA 43 (L) 03/23/2017                 Hepatic Function Markers Lab Results  Component Value Date   AST 15 03/23/2017   ALT 20 03/23/2017   ALBUMIN 4.2 03/23/2017   ALKPHOS 60 03/23/2017                 Electrolytes Lab Results  Component Value  Date   NA 141 03/23/2017   K 4.8 03/23/2017   CL 105 03/23/2017   CALCIUM 9.1 03/23/2017   MG 2.0 03/23/2017                 Neuropathy Markers Lab Results  Component Value Date   VITAMINB12 1,130 03/23/2017  Bone Pathology Markers Lab Results  Component Value Date   ALKPHOS 60 03/23/2017   VD25OH 21 (L) 01/09/2017   CALCIUM 9.1 03/23/2017                 Coagulation Parameters Lab Results  Component Value Date   PLT 139 (L) 01/09/2017                 Cardiovascular Markers Lab Results  Component Value Date   HGB 13.2 01/09/2017   HCT 40.0 01/09/2017                 Note: Lab results reviewed.  Recent Diagnostic Imaging Review  Dg Lumbar Spine Complete W/bend Result Date: 03/24/2017 CLINICAL DATA:  Chronic low back pain EXAM: LUMBAR SPINE - COMPLETE WITH BENDING VIEWS COMPARISON:  None in PACs FINDINGS: The bones are subjectively osteopenic. There is gentle dextrocurvature centered at L2-3. The pedicles and transverse processes are intact. There is mild disc space narrowing at L1-2, L2-3, and L4-5. There is grade 1 anterolisthesis of L4 with respect L5 amounting to 3-4 mm. There is facet joint hypertrophy at L4-5 and at L5-S1. The observed portions of the sacrum are normal. There is calcification in the wall of the abdominal aorta. IMPRESSION: Mild multilevel degenerative disc disease and facet joint change. Grade 1 anterolisthesis of L4 with respect L5. No pars defects or compression fractures. If there are radicular symptoms, lumbar spine MRI may be useful. Abdominal aortic atherosclerosis. Electronically Signed   By: David  Martinique M.D.   On: 03/24/2017 08:26   Dg Shoulder Right Result Date: 03/24/2017 CLINICAL DATA:  Chronic right shoulder pain treated with cortisone shots EXAM: RIGHT SHOULDER - 2+ VIEW COMPARISON:  None in PACs FINDINGS: The bones are subjectively osteopenic. There is mild narrowing of the glenohumeral joint space. There is narrowing of  the subacromial subdeltoid space and of the [facility] joint space. Small spurs arise from the articular margins of the [facility] joint. There is no acute or old fracture. The observed portions of the upper right ribs are normal. IMPRESSION: Moderate osteoarthritic change of the glenohumeral and AC joints. Mild narrowing of the subacromial subdeltoid space may predispose the patient to rotator cuff impingement. Electronically Signed   By: David  Martinique M.D.   On: 03/24/2017 08:27   Shoulder Imaging: Shoulder-R DG:  Results for orders placed during the hospital encounter of 03/24/17  DG Shoulder Right   Narrative CLINICAL DATA:  Chronic right shoulder pain treated with cortisone shots  EXAM: RIGHT SHOULDER - 2+ VIEW  COMPARISON:  None in PACs  FINDINGS: The bones are subjectively osteopenic. There is mild narrowing of the glenohumeral joint space. There is narrowing of the subacromial subdeltoid space and of the [facility] joint space. Small spurs arise from the articular margins of the [facility] joint. There is no acute or old fracture. The observed portions of the upper right ribs are normal.  IMPRESSION: Moderate osteoarthritic change of the glenohumeral and AC joints. Mild narrowing of the subacromial subdeltoid space may predispose the patient to rotator cuff impingement.   Electronically Signed   By: David  Martinique M.D.   On: 03/24/2017 08:27    Lumbosacral Imaging: Lumbar DG Bending views:  Results for orders placed during the hospital encounter of 03/24/17  DG Lumbar Spine Complete W/Bend   Narrative CLINICAL DATA:  Chronic low back pain  EXAM: LUMBAR SPINE - COMPLETE WITH BENDING VIEWS  COMPARISON:  None in PACs  FINDINGS: The bones are subjectively  osteopenic. There is gentle dextrocurvature centered at L2-3. The pedicles and transverse processes are intact. There is mild disc space narrowing at L1-2, L2-3, and L4-5. There is grade 1 anterolisthesis of L4 with respect L5 amounting to 3-4  mm. There is facet joint hypertrophy at L4-5 and at L5-S1. The observed portions of the sacrum are normal. There is calcification in the wall of the abdominal aorta.  IMPRESSION: Mild multilevel degenerative disc disease and facet joint change. Grade 1 anterolisthesis of L4 with respect L5. No pars defects or compression fractures. If there are radicular symptoms, lumbar spine MRI may be useful.  Abdominal aortic atherosclerosis.   Electronically Signed   By: David  Martinique M.D.   On: 03/24/2017 08:26    Note: Results of ordered imaging test(s) reviewed and explained to patient in Layman's terms. Results made available to patient  Meds   Current Meds  Medication Sig  . brimonidine (ALPHAGAN) 0.2 % ophthalmic solution Place 1 drop into both eyes 3 (three) times daily.  . Calcium Carb-Cholecalciferol (CALCIUM PLUS D3 ABSORBABLE) [phone] MG-UNIT CAPS Take 1 capsule by mouth daily with breakfast.  . Cyanocobalamin (VITAMIN B 12 PO) Take 1 tablet by mouth daily.   . dorzolamide-timolol (COSOPT) 22.3-6.8 MG/ML ophthalmic solution   . latanoprost (XALATAN) 0.005 % ophthalmic solution   . lisinopril (PRINIVIL,ZESTRIL) 20 MG tablet Take 1 tablet by mouth daily.  . meloxicam (MOBIC) 15 MG tablet Take 1 tablet by mouth daily.  Marland Kitchen omeprazole (PRILOSEC) 20 MG capsule Take 2 capsules by mouth daily.  Marland Kitchen oxyCODONE-acetaminophen (PERCOCET/ROXICET) 5-325 MG tablet Take 1 tablet by mouth 2 (two) times daily as needed for severe pain.   . pravastatin (PRAVACHOL) 80 MG tablet Take 1 tablet by mouth daily.    ROS  Constitutional: Denies any fever or chills Gastrointestinal: No reported hemesis, hematochezia, vomiting, or acute GI distress Musculoskeletal: Denies any acute onset joint swelling, redness, loss of ROM, or weakness Neurological: No reported episodes of acute onset apraxia, aphasia, dysarthria, agnosia, amnesia, paralysis, loss of coordination, or loss of consciousness  Allergies  Mr.  Voris has No Known Allergies.  [place]  Drug: Mr. Lia  reports that he does not use drugs. Alcohol:  reports that he does not drink alcohol. Tobacco:  reports that he has quit smoking. His smoking use included Cigarettes. He has never used smokeless tobacco. Medical:  has a past medical history of Allergy; Arthritis; Chronic low back pain; GERD (gastroesophageal reflux disease); Glaucoma; Hyperlipidemia; and Hypertension. Surgical: Mr. Kocak  has a past surgical history that includes Cataract extraction (Bilateral, 2010). Family: family history includes Alzheimer's disease in his father and mother; Heart disease in his father.  Constitutional Exam  General appearance: Well nourished, well developed, and well hydrated. In no apparent acute distress Vitals:   04/25/17 0837  BP: (!) 154/57  Pulse: (!) 48  Resp: 18  Temp: 97.6 F (36.4 C)  SpO2: 100%  Weight: 154 lb (69.9 kg)  Height: _0  (1.676 m)   BMI Assessment: Estimated body mass index is 24.86 kg/m as calculated from the following:   Height as of this encounter: _1  (1.676 m).   Weight as of this encounter: 154 lb (69.9 kg).  BMI interpretation table: BMI level Category Range association with higher incidence of chronic pain  <18 kg/m2 Underweight   18.5-24.9 kg/m2 Ideal body weight   25-29.9 kg/m2 Overweight Increased incidence by 20%  30-34.9 kg/m2 Obese (Class I) Increased incidence by 68%  35-39.9 kg/m2 Severe  obesity (Class II) Increased incidence by 136%  >40 kg/m2 Extreme obesity (Class III) Increased incidence by 254%   BMI Readings from Last 4 Encounters:  04/25/17 24.86 kg/m  03/23/17 23.57 kg/m  01/09/17 26.74 kg/m  12/25/15 25.56 kg/m   Wt Readings from Last 4 Encounters:  04/25/17 154 lb (69.9 kg)  03/23/17 155 lb (70.3 kg)  01/09/17 155 lb 12.8 oz (70.7 kg)  12/25/15 163 lb 3.2 oz (74 kg)  Psych/Mental status: Alert, oriented x 3 (person, place, & time)       Eyes: PERLA Respiratory: No  evidence of acute respiratory distress  Cervical Spine Exam  Inspection: No masses, redness, or swelling Alignment: Symmetrical Functional ROM: Unrestricted ROM      Stability: No instability detected Muscle strength & Tone: Functionally intact Sensory: Unimpaired Palpation: No palpable anomalies              Upper Extremity (UE) Exam    Side: Right upper extremity  Side: Left upper extremity  Inspection: No masses, redness, swelling, or asymmetry. No contractures  Inspection: No masses, redness, swelling, or asymmetry. No contractures  Functional ROM: Unrestricted ROM          Functional ROM: Unrestricted ROM          Muscle strength & Tone: Functionally intact  Muscle strength & Tone: Functionally intact  Sensory: Unimpaired  Sensory: Unimpaired  Palpation: No palpable anomalies              Palpation: No palpable anomalies              Specialized Test(s): Deferred         Specialized Test(s): Deferred          Thoracic Spine Exam  Inspection: No masses, redness, or swelling Alignment: Symmetrical Functional ROM: Unrestricted ROM Stability: No instability detected Sensory: Unimpaired Muscle strength & Tone: No palpable anomalies  Lumbar Spine Exam  Inspection: No masses, redness, or swelling Alignment: Symmetrical Functional ROM: Decreased ROM      Stability: No instability detected Muscle strength & Tone: Functionally intact Sensory: Movement-associated pain Palpation: Complains of area being tender to palpation       Provocative Tests: Lumbar Hyperextension and rotation test: Positive bilaterally for facet joint pain. Patrick's Maneuver: evaluation deferred today                    Gait & Posture Assessment  Ambulation: Unassisted Gait: Relatively normal for age and body habitus Posture: WNL   Lower Extremity Exam    Side: Right lower extremity  Side: Left lower extremity  Inspection: No masses, redness, swelling, or asymmetry. No contractures  Inspection: No  masses, redness, swelling, or asymmetry. No contractures  Functional ROM: Unrestricted ROM          Functional ROM: Unrestricted ROM          Muscle strength & Tone: Functionally intact  Muscle strength & Tone: Functionally intact  Sensory: Unimpaired  Sensory: Unimpaired  Palpation: No palpable anomalies  Palpation: No palpable anomalies   Assessment & Plan  Primary Diagnosis & Pertinent Problem List: The primary encounter diagnosis was Chronic low back pain (Primary Area of Pain) (Bilateral) (L>R). Diagnoses of Chronic shoulder pain (Right), Lumbar facet syndrome (Bilateral) (L>R), Chronic pain syndrome, Muscle cramps at night, Musculoskeletal pain, Night muscle spasms, Neurogenic pain, Vitamin D deficiency, Rotator cuff arthropathy (Right), Osteoarthritis of shoulder (Right), and Osteoarthritis of AC (acromioclavicular) joint (Right) were also pertinent to this visit.  Visit  Diagnosis: 1. Chronic low back pain (Primary Area of Pain) (Bilateral) (L>R)   2. Chronic shoulder pain (Right)   3. Lumbar facet syndrome (Bilateral) (L>R)   4. Chronic pain syndrome   5. Muscle cramps at night   6. Musculoskeletal pain   7. Night muscle spasms   8. Neurogenic pain   9. Vitamin D deficiency   10. Rotator cuff arthropathy (Right)   11. Osteoarthritis of shoulder (Right)   12. Osteoarthritis of AC (acromioclavicular) joint (Right)    Problems updated and reviewed during this visit: Problem  Chronic shoulder pain (Secondary Area of pain) (Right)   He indicates having a history of a rotator cuff injury. In addition he indicates receiving intra-articular shoulder joint injections at the [facility], every 90 days. Today we offered the patient alternative to take over this treatment and to consider doing a diagnostic right-sided suprascapular nerve block. Should the never block provides patient with good relief of the pain, he would then be a candidate for radiofrequency ablation of the suprascapular  nerve, which in turn could provide him anywhere from 3 months to 18 months of pain relief, thereby requiring less shoulder injections and less side effects from the steroids.   Lumbar facet syndrome (Bilateral) (L>R)   Today we have offered the patient bilateral diagnostic lumbar facet blocks. Should this provided the patient with good relief of the pain, at least for the duration of local anesthetic, that he would be a good candidate for medial branch radiofrequency ablation. This alternative may provide patient with anywhere from 3 months to 18 months of pain relief. The radio frequencies can also be repeated as many times as needed.   Muscle Cramps At Night  Musculoskeletal Pain  Neurogenic Pain  Night Muscle Spasms  Rotator cuff arthropathy (Right)  Osteoarthritis of shoulder (Right)  Osteoarthritis of AC (acromioclavicular) joint (Right)  Chronic low back pain (Primary Area of Pain) (Bilateral) (L>R)   Present for over 9 years.     Plan of Care  Pharmacotherapy (Medications Ordered): Meds ordered this encounter  Medications  . Calcium Carb-Cholecalciferol (CALCIUM PLUS D3 ABSORBABLE) [phone] MG-UNIT CAPS    Sig: Take 1 capsule by mouth daily with breakfast.    Dispense:  90 capsule    Refill:  0    Do not place medication on "Automatic Refill". Fill one day early if pharmacy is closed on scheduled refill date.   Lab-work, procedure(s), and/or referral(s): No orders of the defined types were placed in this encounter.   Pharmacological management options:  Opioid Analgesics: I will not be prescribing any opioids at this time Membrane stabilizer: None prescribed at this time Muscle relaxant: None prescribed at this time NSAID: None prescribed at this time Other analgesic(s): None prescribed at this time   Interventional management options: Planned, scheduled, and/or pending:    None at this time.    Considering:   Diagnostic bilateral lumbar facet block under  fluoroscopic guidance  Possible bilateral lumbar facet RFA  Diagnostic right intra-articular shoulder joint injection  Diagnostic right acromioclavicular joint injection Diagnostic right suprascapular nerve block  Possible right suprascapular nerve RFA    PRN Procedures:   To be determined at a later time   Provider-requested follow-up: Return if symptoms worsen or fail to improve, for PRN procedure(s), by MD.  No future appointments.  Primary Care Physician: Roselee Nova, MD Location: [facility] Note by: Gaspar Cola, MD Date: 04/25/2017; Time: 6:13 PM  There are  no Patient Instructions on file for this visit.

## 2017-04-25 ENCOUNTER — Encounter: Payer: Self-pay | Admitting: Pain Medicine

## 2017-04-25 ENCOUNTER — Ambulatory Visit: Payer: Medicare Other | Attending: Pain Medicine | Admitting: Pain Medicine

## 2017-04-25 VITALS — BP 154/57 | HR 48 | Temp 97.6°F | Resp 18 | Ht 66.0 in | Wt 154.0 lb

## 2017-04-25 DIAGNOSIS — M792 Neuralgia and neuritis, unspecified: Secondary | ICD-10-CM | POA: Diagnosis not present

## 2017-04-25 DIAGNOSIS — M791 Myalgia: Secondary | ICD-10-CM | POA: Diagnosis not present

## 2017-04-25 DIAGNOSIS — Z79891 Long term (current) use of opiate analgesic: Secondary | ICD-10-CM | POA: Diagnosis not present

## 2017-04-25 DIAGNOSIS — Z79899 Other long term (current) drug therapy: Secondary | ICD-10-CM | POA: Diagnosis not present

## 2017-04-25 DIAGNOSIS — M25511 Pain in right shoulder: Secondary | ICD-10-CM

## 2017-04-25 DIAGNOSIS — R252 Cramp and spasm: Secondary | ICD-10-CM | POA: Insufficient documentation

## 2017-04-25 DIAGNOSIS — G8929 Other chronic pain: Secondary | ICD-10-CM | POA: Diagnosis not present

## 2017-04-25 DIAGNOSIS — M12811 Other specific arthropathies, not elsewhere classified, right shoulder: Secondary | ICD-10-CM | POA: Insufficient documentation

## 2017-04-25 DIAGNOSIS — Z1211 Encounter for screening for malignant neoplasm of colon: Secondary | ICD-10-CM | POA: Diagnosis not present

## 2017-04-25 DIAGNOSIS — E559 Vitamin D deficiency, unspecified: Secondary | ICD-10-CM | POA: Insufficient documentation

## 2017-04-25 DIAGNOSIS — M62838 Other muscle spasm: Secondary | ICD-10-CM | POA: Insufficient documentation

## 2017-04-25 DIAGNOSIS — M7918 Myalgia, other site: Secondary | ICD-10-CM | POA: Insufficient documentation

## 2017-04-25 DIAGNOSIS — G894 Chronic pain syndrome: Secondary | ICD-10-CM | POA: Diagnosis not present

## 2017-04-25 DIAGNOSIS — M19012 Primary osteoarthritis, left shoulder: Secondary | ICD-10-CM | POA: Diagnosis not present

## 2017-04-25 DIAGNOSIS — E785 Hyperlipidemia, unspecified: Secondary | ICD-10-CM | POA: Diagnosis not present

## 2017-04-25 DIAGNOSIS — Z87891 Personal history of nicotine dependence: Secondary | ICD-10-CM | POA: Diagnosis not present

## 2017-04-25 DIAGNOSIS — M4696 Unspecified inflammatory spondylopathy, lumbar region: Secondary | ICD-10-CM

## 2017-04-25 DIAGNOSIS — M19011 Primary osteoarthritis, right shoulder: Secondary | ICD-10-CM

## 2017-04-25 DIAGNOSIS — M545 Low back pain: Secondary | ICD-10-CM

## 2017-04-25 DIAGNOSIS — I10 Essential (primary) hypertension: Secondary | ICD-10-CM | POA: Diagnosis not present

## 2017-04-25 DIAGNOSIS — M47816 Spondylosis without myelopathy or radiculopathy, lumbar region: Secondary | ICD-10-CM

## 2017-04-25 MED ORDER — CALCIUM PLUS D3 ABSORBABLE 600-2500 MG-UNIT PO CAPS: 1.0000 | ORAL_CAPSULE | Freq: Every day | ORAL | 0 refills | Status: AC

## 2017-08-09 ENCOUNTER — Ambulatory Visit: Payer: Medicare Other | Admitting: Family Medicine

## 2017-12-08 ENCOUNTER — Ambulatory Visit (INDEPENDENT_AMBULATORY_CARE_PROVIDER_SITE_OTHER): Payer: Medicare Other

## 2017-12-08 ENCOUNTER — Telehealth: Payer: Self-pay | Admitting: Family Medicine

## 2017-12-08 VITALS — BP 138/70 | HR 60 | Temp 97.5°F | Resp 14 | Ht 66.0 in | Wt 171.4 lb

## 2017-12-08 DIAGNOSIS — Z23 Encounter for immunization: Secondary | ICD-10-CM | POA: Diagnosis not present

## 2017-12-08 DIAGNOSIS — Z0000 Encounter for general adult medical examination without abnormal findings: Secondary | ICD-10-CM | POA: Diagnosis not present

## 2017-12-08 NOTE — Telephone Encounter (Signed)
Copied from CRM [id number]. Topic: Quick Communication - See Telephone Encounter >> Dec 08, 2017  2:17 PM Herby Abraham C wrote: CRM for notification. See Telephone encounter for: pt called in he said that he just had his wellness visit and was told that he need to also get his shingles vac and tetanus shot. Pt says that he told nurse that he is going to have them done at the [place], he called the [place] and they advised pt that he need to bring something to show that he needs them (a letter or something) pt says that he would like to come back and pick up today. I did ask pt if would call him when ready, he said no, he would like to just come back today.    Please advise  12/08/17.

## 2017-12-08 NOTE — Telephone Encounter (Signed)
Wrote a rx , he can pick it up .

## 2017-12-08 NOTE — Progress Notes (Addendum)
Subjective:   Brett Gill is a 83 y.o. male who presents for Medicare Annual/Subsequent preventive examination.  Review of Systems:  N/A Cardiac Risk Factors include: advanced age (>20men, >47 women);dyslipidemia;hypertension;male gender;sedentary lifestyle     Objective:    Vitals: BP 138/70 (BP Location: Left Arm, Patient Position: Sitting, Cuff Size: Normal)   Pulse 60   Temp (!) 97.5 F (36.4 C) (Oral)   Resp 14   Ht 5\' 6"  (1.676 m)   Wt 171 lb 6.4 oz (77.7 kg)   BMI 27.66 kg/m   Body mass index is 27.66 kg/m.  Advanced Directives 12/08/2017 04/25/2017 03/23/2017 01/09/2017 12/25/2015 12/10/2015  Does Patient Have a Medical Advance Directive? No No No No No No  Would patient like information on creating a medical advance directive? Yes (MAU/Ambulatory/Procedural Areas - Information given) No - Patient declined - No - Patient declined No - patient declined information No - patient declined information    Tobacco Social History   Tobacco Use  Smoking Status Former Smoker  . Packs/day: 0.50  . Years: 20.00  . Pack years: 10.00  . Types: Cigarettes  . Last attempt to quit: 1978  . Years since quitting: 41.1  Smokeless Tobacco Never Used  Tobacco Comment   smoking cessation materials not required     Counseling given: No Comment: smoking cessation materials not required   Clinical Intake:  Pre-visit preparation completed: Yes  Pain : No/denies pain   BMI - recorded: 27.66 Nutritional Status: BMI 25 -29 Overweight Nutritional Risks: None(pt states his activity level has reduced tremendously during the winter months.) Diabetes: No  How often do you need to have someone help you when you read instructions, pamphlets, or other written materials from your doctor or pharmacy?: 1 - Never  Interpreter Needed?: No  Information entered by :: AEversole, LPN  Hospitalizations, surgeries, and ER visits occurring within the previous 12 months:  Within the previous 12  months, pt has not underwent any surgical procedures, has not been hospitalized for any conditions and has not been treated by an emergency room clinician.  Past Medical History:  Diagnosis Date  . Allergy   . Arthritis   . Chronic low back pain    Present for 8-9 years  . GERD (gastroesophageal reflux disease)   . Glaucoma    Follows at the [id number]  . Glaucoma   . Hyperlipidemia   . Hypertension   . Shortness of breath    started Albuterol   Past Surgical History:  Procedure Laterality Date  . CATARACT EXTRACTION Bilateral 2010   Family History  Problem Relation Age of Onset  . Alzheimer's disease Mother   . Alzheimer's disease Father   . Heart disease Father   . Hypertension Sister   . Healthy Brother    Social History   Socioeconomic History  . Marital status: Divorced    Spouse name: None  . Number of children: 1  . Years of education: None  . Highest education level: 11th grade  Social Needs  . Financial resource strain: Not hard at all  . Food insecurity - worry: Never true  . Food insecurity - inability: Never true  . Transportation needs - medical: No  . Transportation needs - non-medical: No  Occupational History  . Occupation: Retired  Tobacco Use  . Smoking status: Former Smoker    Packs/day: 0.50    Years: 20.00    Pack years: 10.00    Types: Cigarettes    Last attempt  to quit: 1978    Years since quitting: 41.1  . Smokeless tobacco: Never Used  . Tobacco comment: smoking cessation materials not required  Substance and Sexual Activity  . Alcohol use: No    Alcohol/week: 0.0 oz  . Drug use: No  . Sexual activity: No  Other Topics Concern  . None  Social History Narrative  . None    Outpatient Encounter Medications as of 12/08/2017  Medication Sig  . Albuterol Sulfate 108 (90 Base) MCG/ACT AEPB Inhale 1 puff into the lungs 4 (four) times daily.  . brimonidine (ALPHAGAN) 0.2 % ophthalmic solution Place 1 drop into both eyes 3 (three) times daily.   . Cyanocobalamin (VITAMIN B 12 PO) Take 1 tablet by mouth daily.   . dorzolamide-timolol (COSOPT) 22.3-6.8 MG/ML ophthalmic solution Place 1 drop into both eyes 2 (two) times daily.   Marland Kitchen latanoprost (XALATAN) 0.005 % ophthalmic solution Place 1 drop into both eyes at bedtime.   Marland Kitchen lisinopril (PRINIVIL,ZESTRIL) 20 MG tablet Take 1 tablet by mouth daily.  . meloxicam (MOBIC) 15 MG tablet Take 1 tablet by mouth daily.  Marland Kitchen omeprazole (PRILOSEC) 20 MG capsule Take 2 capsules by mouth daily.  Marland Kitchen oxyCODONE-acetaminophen (PERCOCET/ROXICET) 5-325 MG tablet Take 1 tablet by mouth 2 (two) times daily as needed for severe pain.   . pravastatin (PRAVACHOL) 80 MG tablet Take 1 tablet by mouth daily.  . Calcium Carb-Cholecalciferol (CALCIUM PLUS D3 ABSORBABLE) [phone] MG-UNIT CAPS Take 1 capsule by mouth daily with breakfast.   No facility-administered encounter medications on file as of 12/08/2017.     Activities of Daily Living In your present state of health, do you have any difficulty performing the following activities: 12/08/2017 01/09/2017  Hearing? N N  Comment denies hearing aids -  Vision? N N  Comment denies wearing glasses -  Difficulty concentrating or making decisions? N N  Walking or climbing stairs? N N  Dressing or bathing? N N  Doing errands, shopping? N N  Preparing Food and eating ? N N  Comment denies wearing dentures -  Using the Toilet? N N  In the past six months, have you accidently leaked urine? N N  Do you have problems with loss of bowel control? N N  Managing your Medications? N N  Managing your Finances? N N  Housekeeping or managing your Housekeeping? N N  Some recent data might be hidden    Patient Care Team: Alba Cory, MD as PCP - General (Family Medicine) Noel Gerold, [place] as Consulting Physician   Assessment:   This is a routine wellness examination for Brett Gill.  Exercise Activities and Dietary recommendations Current Exercise Habits: The patient does not  participate in regular exercise at present, Exercise limited by: None identified  Goals    . Exercise 150 min/wk Moderate Activity     Recommend to exercise for at least 150 minutes per week.       Fall Risk Fall Risk  12/08/2017 04/25/2017 03/23/2017 01/09/2017 12/25/2015  Falls in the past year? No No No No No   Is the patient's home free of loose throw rugs in walkways, pet beds, electrical cords, etc?   Yes Does the patient have any grab bars in the bathroom? Yes  Does the patient use a shower chair when bathing? No Does the patient have any stairs in or around the home? Yes If so, are there any handrails?  Yes Does the patient have adequate lighting?  Yes Does the patient use a  cane, walker or w/c? No Does the patient use of an elevated toilet seat? No  Timed Get Up and Go Performed: Yes. Pt ambulated 10 feet within 10 sec. Gait stead-fast and without the use of an assistive device. No intervention required at this time. Fall risk prevention has been discussed.  Pt declined my offer to send Community Resource Referral to Care Guide shower chair or an elevated toilet seat.  Depression Screen PHQ 2/9 Scores 12/08/2017 04/25/2017 03/23/2017 01/09/2017  PHQ - 2 Score 0 0 0 0  PHQ- 9 Score 0 - - -    Cognitive Function     6CIT Screen 12/08/2017 01/09/2017  What Year? 0 points 0 points  What month? 0 points 0 points  What time? 0 points 0 points  Count back from 20 0 points 0 points  Months in reverse 0 points 0 points  Repeat phrase 0 points 0 points  Total Score 0 0    Immunization History  Administered Date(s) Administered  . Influenza-Unspecified 08/11/2015, 07/29/2017  . Pneumococcal Conjugate-13 12/08/2017    Qualifies for Shingles Vaccine? Yes. Due for Zostavax or Shingrix vaccine. Education has been provided regarding the importance of this vaccine. Pt has been advised to call his insurance company to determine his out of pocket expense. Advised he may also receive this  vaccine at his local pharmacy or Health Dept. Verbalized acceptance and understanding.  Due for Tdap vaccine. Declined my offer to administer today. Education has been provided regarding the importance of this vaccine but still declined. Pt has been advised to call our office is he should change his/her mind and wish to receive this vaccine. Also advised he may receive this vaccine at his local pharmacy or Health Dept. Verbalized acceptance and understanding.  Screening Tests Health Maintenance  Topic Date Due  . TETANUS/TDAP  12/09/2026 (Originally 02/12/1954)  . PNA vac Low Risk Adult (2 of 2 - PPSV23) 12/09/2018  . INFLUENZA VACCINE  Completed   Cancer Screenings: Colorectal Screening: [facility] Was informed colon cancer screenings are no longer required. Lung Cancer Screening: (Low Dose CT Chest recommended if Age 35-80 years, 30 pack-year currently smoking OR have quit w/in 15years.) Does not qualify.   Additional Screenings: Hepatitis C Screening: Does not qualify    Plan:  I have personally reviewed and addressed the Medicare Annual Wellness questionnaire and have noted the following in the patient's chart:  A. Medical and social history B. Use of alcohol, tobacco or illicit drugs  C. Current medications and supplements D. Functional ability and status E.  Nutritional status F.  Physical activity G. Advance directives H. List of other physicians I.  Hospitalizations, surgeries, and ER visits in previous 12 months J.  Vitals K. Screenings such as hearing and vision if needed, cognitive and depression L. Referrals and appointments - none  In addition, I have reviewed and discussed with patient certain preventive protocols, quality metrics, and best practice recommendations. A written personalized care plan for preventive services as well as general preventive health recommendations were provided to patient.  See attached scanned  questionnaire for additional information.   Signed,  Deon Pilling, LPN Nurse Health Advisor  I have reviewed this encounter including the documentation in this note and/or discussed this patient with the Deboraha Sprang, FNP, NP-C. I am certifying that I agree with the content of this note as supervising physician.  Alba Cory, MD [facility] 12/08/2017, 5:14  PM

## 2017-12-08 NOTE — Patient Instructions (Signed)
Brett Gill , Thank you for taking time to come for your Medicare Wellness Visit. I appreciate your ongoing commitment to your health goals. Please review the following plan we discussed and let me know if I can assist you in the future.   Screening recommendations/referrals: Colorectal Screening: No longer required Lung Cancer Screening: You do not qualify for this screening Hepatitis C Screening: You do not qualify for this screening  Vision/Dental Exams: Recommended yearly ophthalmology/optometry visit for glaucoma screening and checkup Recommended yearly dental visit for hygiene and checkup  Vaccinations: Influenza vaccine: Up to date Pneumococcal vaccine: PCV13 given today Tdap vaccine: Declined. Please call your insurance company to determine your out of pocket expense. You may also receive this vaccine at your local pharmacy or Health Dept. Shingles vaccine: Please call your insurance company to determine your out of pocket expense for the Shingrix vaccine. You may also receive this vaccine at your local pharmacy or Health Dept.    Advanced directives: Advance directive discussed with you today. I have provided a copy for you to complete at home and have notarized. Once this is complete please bring a copy in to our office so we can scan it into your chart.  Conditions/risks identified: Recommend to drink at least 6-8 8oz glasses of water per day.  Next appointment: Please schedule an appointment with Dr. Carlynn Purl within the next 30 days for follow up after today's Annual Wellness Visit.  Please schedule your Annual Wellness Visit with your Nurse Health Advisor in one year.  Preventive Care 14 Years and Older, Male Preventive care refers to lifestyle choices and visits with your health care provider that can promote health and wellness. What does preventive care include?  A yearly physical exam. This is also called an annual well check.  Dental exams once or twice a  year.  Routine eye exams. Ask your health care provider how often you should have your eyes checked.  Personal lifestyle choices, including:  Daily care of your teeth and gums.  Regular physical activity.  Eating a healthy diet.  Avoiding tobacco and drug use.  Limiting alcohol use.  Practicing safe sex.  Taking low doses of aspirin every day.  Taking vitamin and mineral supplements as recommended by your health care provider. What happens during an annual well check? The services and screenings done by your health care provider during your annual well check will depend on your age, overall health, lifestyle risk factors, and family history of disease. Counseling  Your health care provider may ask you questions about your:  Alcohol use.  Tobacco use.  Drug use.  Emotional well-being.  Home and relationship well-being.  Sexual activity.  Eating habits.  History of falls.  Memory and ability to understand (cognition).  Work and work Astronomer. Screening  You may have the following tests or measurements:  Height, weight, and BMI.  Blood pressure.  Lipid and cholesterol levels. These may be checked every 5 years, or more frequently if you are over 60 years old.  Skin check.  Lung cancer screening. You may have this screening every year starting at age 86 if you have a 30-pack-year history of smoking and currently smoke or have quit within the past 15 years.  Fecal occult blood test (FOBT) of the stool. You may have this test every year starting at age 22.  Flexible sigmoidoscopy or colonoscopy. You may have a sigmoidoscopy every 5 years or a colonoscopy every 10 years starting at age 30.  Prostate cancer  screening. Recommendations will vary depending on your family history and other risks.  Hepatitis C blood test.  Hepatitis B blood test.  Sexually transmitted disease (STD) testing.  Diabetes screening. This is done by checking your blood sugar  (glucose) after you have not eaten for a while (fasting). You may have this done every 1-3 years.  Abdominal aortic aneurysm (AAA) screening. You may need this if you are a current or former smoker.  Osteoporosis. You may be screened starting at age 48 if you are at high risk. Talk with your health care provider about your test results, treatment options, and if necessary, the need for more tests. Vaccines  Your health care provider may recommend certain vaccines, such as:  Influenza vaccine. This is recommended every year.  Tetanus, diphtheria, and acellular pertussis (Tdap, Td) vaccine. You may need a Td booster every 10 years.  Zoster vaccine. You may need this after age 77.  Pneumococcal 13-valent conjugate (PCV13) vaccine. One dose is recommended after age 67.  Pneumococcal polysaccharide (PPSV23) vaccine. One dose is recommended after age 59. Talk to your health care provider about which screenings and vaccines you need and how often you need them. This information is not intended to replace advice given to you by your health care provider. Make sure you discuss any questions you have with your health care provider. Document Released: 10/23/2015 Document Revised: 06/15/2016 Document Reviewed: 07/28/2015 Elsevier Interactive Patient Education  2017 [employer] Prevention in the Home Falls can cause injuries. They can happen to people of all ages. There are many things you can do to make your home safe and to help prevent falls. What can I do on the outside of my home?  Regularly fix the edges of walkways and driveways and fix any cracks.  Remove anything that might make you trip as you walk through a door, such as a raised step or threshold.  Trim any bushes or trees on the path to your home.  Use bright outdoor lighting.  Clear any walking paths of anything that might make someone trip, such as rocks or tools.  Regularly check to see if handrails are loose or  broken. Make sure that both sides of any steps have handrails.  Any raised decks and porches should have guardrails on the edges.  Have any leaves, snow, or ice cleared regularly.  Use sand or salt on walking paths during winter.  Clean up any spills in your garage right away. This includes oil or grease spills. What can I do in the bathroom?  Use night lights.  Install grab bars by the toilet and in the tub and shower. Do not use towel bars as grab bars.  Use non-skid mats or decals in the tub or shower.  If you need to sit down in the shower, use a plastic, non-slip stool.  Keep the floor dry. Clean up any water that spills on the floor as soon as it happens.  Remove soap buildup in the tub or shower regularly.  Attach bath mats securely with double-sided non-slip rug tape.  Do not have throw rugs and other things on the floor that can make you trip. What can I do in the bedroom?  Use night lights.  Make sure that you have a light by your bed that is easy to reach.  Do not use any sheets or blankets that are too big for your bed. They should not hang down onto the floor.  Have a firm  chair that has side arms. You can use this for support while you get dressed.  Do not have throw rugs and other things on the floor that can make you trip. What can I do in the kitchen?  Clean up any spills right away.  Avoid walking on wet floors.  Keep items that you use a lot in easy-to-reach places.  If you need to reach something above you, use a strong step stool that has a grab bar.  Keep electrical cords out of the way.  Do not use floor polish or wax that makes floors slippery. If you must use wax, use non-skid floor wax.  Do not have throw rugs and other things on the floor that can make you trip. What can I do with my stairs?  Do not leave any items on the stairs.  Make sure that there are handrails on both sides of the stairs and use them. Fix handrails that are  broken or loose. Make sure that handrails are as long as the stairways.  Check any carpeting to make sure that it is firmly attached to the stairs. Fix any carpet that is loose or worn.  Avoid having throw rugs at the top or bottom of the stairs. If you do have throw rugs, attach them to the floor with carpet tape.  Make sure that you have a light switch at the top of the stairs and the bottom of the stairs. If you do not have them, ask someone to add them for you. What else can I do to help prevent falls?  Wear shoes that:  Do not have high heels.  Have rubber bottoms.  Are comfortable and fit you well.  Are closed at the toe. Do not wear sandals.  If you use a stepladder:  Make sure that it is fully opened. Do not climb a closed stepladder.  Make sure that both sides of the stepladder are locked into place.  Ask someone to hold it for you, if possible.  Clearly mark and make sure that you can see:  Any grab bars or handrails.  First and last steps.  Where the edge of each step is.  Use tools that help you move around (mobility aids) if they are needed. These include:  Canes.  Walkers.  Scooters.  Crutches.  Turn on the lights when you go into a dark area. Replace any light bulbs as soon as they burn out.  Set up your furniture so you have a clear path. Avoid moving your furniture around.  If any of your floors are uneven, fix them.  If there are any pets around you, be aware of where they are.  Review your medicines with your doctor. Some medicines can make you feel dizzy. This can increase your chance of falling. Ask your doctor what other things that you can do to help prevent falls. This information is not intended to replace advice given to you by your health care provider. Make sure you discuss any questions you have with your health care provider. Document Released: 07/23/2009 Document Revised: 03/03/2016 Document Reviewed: 10/31/2014 Elsevier  Interactive Patient Education  2017 [employer].

## 2017-12-08 NOTE — Telephone Encounter (Signed)
Patient is needing a rx for the Shingles and a T-dap so that he can take this to the [facility]. They told him to get this before comes to the [place] on the 18th of march

## 2017-12-11 NOTE — Telephone Encounter (Signed)
Patient was informed and upfront ready for pick up.

## 2018-08-31 ENCOUNTER — Ambulatory Visit: Payer: Self-pay

## 2018-08-31 ENCOUNTER — Encounter: Payer: Self-pay | Admitting: Nurse Practitioner

## 2018-08-31 ENCOUNTER — Ambulatory Visit (INDEPENDENT_AMBULATORY_CARE_PROVIDER_SITE_OTHER): Payer: Medicare Other | Admitting: Nurse Practitioner

## 2018-08-31 VITALS — BP 130/70 | HR 72 | Temp 99.3°F | Resp 16 | Ht 66.0 in | Wt 169.7 lb

## 2018-08-31 DIAGNOSIS — R05 Cough: Secondary | ICD-10-CM | POA: Diagnosis not present

## 2018-08-31 DIAGNOSIS — R059 Cough, unspecified: Secondary | ICD-10-CM

## 2018-08-31 MED ORDER — BENZONATATE 100 MG PO CAPS: 200.0000 mg | ORAL_CAPSULE | Freq: Two times a day (BID) | ORAL | 0 refills | Status: DC | PRN

## 2018-08-31 NOTE — Patient Instructions (Addendum)
-   Please take cough medicine ( tessalon perls) twice a day as needed for cough - Please take musinex twice a day to help with congestion - Continue all other regular medications - If having some shortness of breath or worsening cough; Please go to [facility] across the street on Monday - If having concerning shortness of breath, fevers, chills weakness please go to ER  - Brace your chest when coughing.

## 2018-08-31 NOTE — Telephone Encounter (Signed)
Pt. Reports he started coughing 2 days ago. Non-productive, "but I'm rattling." Denies any runny nose, fever. Denies any chest pain or shortness of breath. States "I don't think I need to see a doctor.I just want to know what to take for cough." Will call and ask his pharmacist. Instructed if no better next week, to call back. Verbalizes understanding. Reviewed home remedies for cough.  Reason for Disposition . Cough  Answer Assessment - Initial Assessment Questions 1. ONSET: "When did the cough begin?"      Started 2 days ago 2. SEVERITY: "How bad is the cough today?"      Used his albuterol 3. RESPIRATORY DISTRESS: "Describe your breathing."       No distress 4. FEVER: "Do you have a fever?" If so, ask: "What is your temperature, how was it measured, and when did it start?"     No 5. HEMOPTYSIS: "Are you coughing up any blood?" If so ask: "How much?" (flecks, streaks, tablespoons, etc.)     No 6. TREATMENT: "What have you done so far to treat the cough?" (e.g., meds, fluids, humidifier)     Tried Tussin 7. CARDIAC HISTORY: "Do you have any history of heart disease?" (e.g., heart attack, congestive heart failure)      HTN 8. LUNG HISTORY: "Do you have any history of lung disease?"  (e.g., pulmonary embolus, asthma, emphysema)     None 9. PE RISK FACTORS: "Do you have a history of blood clots?" (or: recent major surgery, recent prolonged travel, bedridden)     No 10. OTHER SYMPTOMS: "Do you have any other symptoms? (e.g., runny nose, wheezing, chest pain)       No 11. PREGNANCY: "Is there any chance you are pregnant?" "When was your last menstrual period?"       n/a 12. TRAVEL: "Have you traveled out of the country in the last month?" (e.g., travel history, exposures)       No  Protocols used: COUGH - ACUTE NON-PRODUCTIVE-A-AH

## 2018-08-31 NOTE — Progress Notes (Signed)
Name: Brett Gill   MRN: [id number]    DOB: May 19, 1935   Date:08/31/2018       Progress Note  Subjective  Chief Complaint  Chief Complaint  Patient presents with  . Cough    Complains of excessive dry cough x 2 days. Worse today. Pain in chest when he coughs.    HPI  Complains of excessive dry cough x 2 days. Worse today. Pain in central chest when he coughs- no radiating only some soreness with coughing .Complains of excessive dry cough x 2 days. Worse today. Pain in chest when he coughs. No shortness of breath, sore throat, runny nose, fevers, chills.  Patient Active Problem List   Diagnosis Date Noted  . Chronic shoulder pain (Secondary Area of pain) (Right) 04/25/2017  . Lumbar facet syndrome (Bilateral) (L>R) 04/25/2017  . Muscle cramps at night 04/25/2017  . Musculoskeletal pain 04/25/2017  . Neurogenic pain 04/25/2017  . Night muscle spasms 04/25/2017  . Rotator cuff arthropathy (Right) 04/25/2017  . Osteoarthritis of shoulder (Right) 04/25/2017  . Osteoarthritis of AC (acromioclavicular) joint (Right) 04/25/2017  . Long term current use of opiate analgesic 03/23/2017  . Chronic pain syndrome 03/23/2017  . Long term prescription opiate use 03/23/2017  . Opiate use 03/23/2017  . Chronic low back pain (Primary Area of Pain) (Bilateral) (L>R) 03/23/2017  . Encounter for Medicare annual wellness exam 01/09/2017  . Screening for colon cancer 01/09/2017  . Vitamin D deficiency 12/31/2015  . Annual physical exam 12/25/2015  . Encounter to establish care with new doctor 12/11/2015  . Hypertension 12/10/2015  . Hyperlipidemia 12/10/2015    Past Medical History:  Diagnosis Date  . Allergy   . Arthritis   . Chronic low back pain    Present for 8-9 years  . GERD (gastroesophageal reflux disease)   . Glaucoma    Follows at [facility] [place]  . Glaucoma   . Hyperlipidemia   . Hypertension   . Shortness of breath    started Albuterol    Past Surgical History:  Procedure  Laterality Date  . CATARACT EXTRACTION Bilateral 2010    Social History   Tobacco Use  . Smoking status: Former Smoker    Packs/day: 0.50    Years: 20.00    Pack years: 10.00    Types: Cigarettes    Last attempt to quit: 1978    Years since quitting: 41.9  . Smokeless tobacco: Never Used  . Tobacco comment: smoking cessation materials not required  Substance Use Topics  . Alcohol use: No    Alcohol/week: 0.0 standard drinks     Current Outpatient Medications:  .  Albuterol Sulfate 108 (90 Base) MCG/ACT AEPB, Inhale 1 puff into the lungs 4 (four) times daily., Disp: , Rfl:  .  brimonidine (ALPHAGAN) 0.2 % ophthalmic solution, Place 1 drop into both eyes 3 (three) times daily., Disp: , Rfl:  .  Cyanocobalamin (VITAMIN B 12 PO), Take 1 tablet by mouth daily. , Disp: , Rfl:  .  dorzolamide-timolol (COSOPT) 22.3-6.8 MG/ML ophthalmic solution, Place 1 drop into both eyes 2 (two) times daily. , Disp: , Rfl:  .  latanoprost (XALATAN) 0.005 % ophthalmic solution, Place 1 drop into both eyes at bedtime. , Disp: , Rfl:  .  lisinopril (PRINIVIL,ZESTRIL) 20 MG tablet, Take 1 tablet by mouth daily., Disp: , Rfl:  .  meloxicam (MOBIC) 7.5 MG tablet, , Disp: , Rfl:  .  omeprazole (PRILOSEC) 20 MG capsule, Take 2 capsules by mouth daily.,  Disp: , Rfl:  .  oxyCODONE-acetaminophen (PERCOCET/ROXICET) 5-325 MG tablet, Take 1 tablet by mouth 2 (two) times daily as needed for severe pain. , Disp: , Rfl:  .  pravastatin (PRAVACHOL) 80 MG tablet, Take 1 tablet by mouth daily., Disp: , Rfl:  .  benzonatate (TESSALON PERLES) 100 MG capsule, Take 2 capsules (200 mg total) by mouth 2 (two) times daily as needed for cough., Disp: 30 capsule, Rfl: 0 .  Calcium Carb-Cholecalciferol (CALCIUM PLUS D3 ABSORBABLE) [phone] MG-UNIT CAPS, Take 1 capsule by mouth daily with breakfast., Disp: 90 capsule, Rfl: 0  No Known Allergies  ROS  No other specific complaints in a complete review of systems (except as listed  in HPI above).  Objective  Vitals:   08/31/18 1415  BP: 130/70  Pulse: 72  Resp: 16  Temp: 99.3 F (37.4 C)  TempSrc: Oral  SpO2: 97%  Weight: 169 lb 11.2 oz (77 kg)  Height: 5\' 6"  (1.676 m)     Body mass index is 27.39 kg/m.  Nursing Note and Vital Signs reviewed.  Physical Exam  Constitutional: He is oriented to person, place, and time. He appears well-developed and well-nourished. He is cooperative.  HENT:  Head: Normocephalic and atraumatic.  Right Ear: Hearing, tympanic membrane, external ear and ear canal normal.  Left Ear: Hearing, tympanic membrane, external ear and ear canal normal.  Nose: Nose normal. Right sinus exhibits no maxillary sinus tenderness and no frontal sinus tenderness. Left sinus exhibits no maxillary sinus tenderness and no frontal sinus tenderness.  Mouth/Throat: Uvula is midline, oropharynx is clear and moist and mucous membranes are normal. No oropharyngeal exudate.  Eyes: Conjunctivae are normal.  Cardiovascular: Normal rate, regular rhythm and normal heart sounds.  Pulmonary/Chest: Effort normal and breath sounds normal. No respiratory distress. Decreased breath sounds: initial right lobe wheezing resolved with coughing.  Abdominal: Normal appearance.  Musculoskeletal: Normal range of motion.  Neurological: He is alert and oriented to person, place, and time.  Skin: Skin is warm and dry. No rash noted.  Psychiatric: He has a normal mood and affect. His speech is normal and behavior is normal. Judgment and thought content normal.     No results found for this or any previous visit (from the past 48 hour(s)).  Assessment & Plan  1. Cough - Please take cough medicine ( tessalon perls) twice a day as needed for cough - Please take musinex twice a day to help with congestion - Continue all other regular medications - If having some shortness of breath or worsening cough; Please go to [facility] across the street on Monday - If  having concerning shortness of breath, fevers, chills weakness please go to ER  - Brace your chest when coughing.  - benzonatate (TESSALON PERLES) 100 MG capsule; Take 2 capsules (200 mg total) by mouth 2 (two) times daily as needed for cough.  Dispense: 30 capsule; Refill: 0 - DG Chest 2 View; Future

## 2018-09-02 ENCOUNTER — Emergency Department
Admission: EM | Admit: 2018-09-02 | Discharge: 2018-09-02 | Disposition: A | Discharge status: Home / Self Care | Payer: Medicare Other | Attending: Emergency Medicine | Admitting: Emergency Medicine

## 2018-09-02 ENCOUNTER — Emergency Department: Payer: Medicare Other

## 2018-09-02 ENCOUNTER — Encounter: Payer: Self-pay | Admitting: Emergency Medicine

## 2018-09-02 DIAGNOSIS — J40 Bronchitis, not specified as acute or chronic: Secondary | ICD-10-CM | POA: Insufficient documentation

## 2018-09-02 DIAGNOSIS — R05 Cough: Secondary | ICD-10-CM | POA: Diagnosis present

## 2018-09-02 DIAGNOSIS — Z87891 Personal history of nicotine dependence: Secondary | ICD-10-CM | POA: Diagnosis not present

## 2018-09-02 DIAGNOSIS — R001 Bradycardia, unspecified: Secondary | ICD-10-CM | POA: Diagnosis not present

## 2018-09-02 DIAGNOSIS — I10 Essential (primary) hypertension: Secondary | ICD-10-CM | POA: Diagnosis not present

## 2018-09-02 DIAGNOSIS — R0602 Shortness of breath: Secondary | ICD-10-CM | POA: Diagnosis not present

## 2018-09-02 DIAGNOSIS — Z79899 Other long term (current) drug therapy: Secondary | ICD-10-CM | POA: Insufficient documentation

## 2018-09-02 LAB — CBC
HEMATOCRIT: 41.3 % (ref 39.0–52.0)
Hemoglobin: 13.6 g/dL (ref 13.0–17.0)
MCH: 31.7 pg (ref 26.0–34.0)
MCHC: 32.9 g/dL (ref 30.0–36.0)
MCV: 96.3 fL (ref 80.0–100.0)
NRBC: 0 % (ref 0.0–0.2)
Platelets: 148 10*3/uL — ABNORMAL LOW (ref 150–400)
RBC: 4.29 MIL/uL (ref 4.22–5.81)
RDW: 12.6 % (ref 11.5–15.5)
WBC: 7.4 10*3/uL (ref 4.0–10.5)

## 2018-09-02 LAB — BASIC METABOLIC PANEL
Anion gap: 6 (ref 5–15)
BUN: 23 mg/dL (ref 8–23)
CHLORIDE: 110 mmol/L (ref 98–111)
CO2: 23 mmol/L (ref 22–32)
Calcium: 8.6 mg/dL — ABNORMAL LOW (ref 8.9–10.3)
Creatinine, Ser: 1.55 mg/dL — ABNORMAL HIGH (ref 0.61–1.24)
GFR calc non Af Amer: 40 mL/min — ABNORMAL LOW (ref >60)
GFR, EST AFRICAN AMERICAN: 47 mL/min — AB (ref >60)
Glucose, Bld: 117 mg/dL — ABNORMAL HIGH (ref 70–99)
POTASSIUM: 4.5 mmol/L (ref 3.5–5.1)
SODIUM: 139 mmol/L (ref 135–145)

## 2018-09-02 LAB — TROPONIN I: Troponin I: <0.03 ng/mL (ref <0.03)

## 2018-09-02 MED ORDER — PREDNISONE 20 MG PO TABS: 60.0000 mg | ORAL_TABLET | Freq: Every day | ORAL | 0 refills | Status: AC

## 2018-09-02 MED ORDER — PREDNISONE 20 MG PO TABS
60.0000 mg | ORAL_TABLET | Freq: Once | ORAL | Status: AC
  Administered 2018-09-02: 60 mg via ORAL
  Filled 2018-09-02: qty 3

## 2018-09-02 MED ORDER — ALBUTEROL SULFATE HFA 108 (90 BASE) MCG/ACT IN AERS: 2.0000 | INHALATION_SPRAY | Freq: Four times a day (QID) | RESPIRATORY_TRACT | 2 refills | Status: AC | PRN

## 2018-09-02 MED ORDER — IPRATROPIUM-ALBUTEROL 0.5-2.5 (3) MG/3ML IN SOLN
3.0000 mL | Freq: Once | RESPIRATORY_TRACT | Status: AC
  Administered 2018-09-02: 3 mL via RESPIRATORY_TRACT
  Filled 2018-09-02: qty 3

## 2018-09-02 NOTE — ED Notes (Signed)
Report from dee, rn.  

## 2018-09-02 NOTE — ED Provider Notes (Signed)
[facility] Emergency Department Provider Note  ____________________________________________  Time seen: Approximately 11:01 PM  I have reviewed the triage vital signs and the nursing notes.   HISTORY  Chief Complaint Cough and Shortness of Breath   HPI Brett Gill is a 83 y.o. male history of hypertension, hyperlipidemia, former smoker who presents for evaluation of cough.  Patient reports a cough productive of yellow sputum for over 7 days.  He went to see his primary care doctor 2 days ago and he was given Lawyer.  He denies any improvement of his symptoms.  No shortness of breath, no fever, no chills, no nausea, no vomiting, no body aches, no diarrhea, no chest pain.  No personal prior history of blood clots, recent travel immobilization, leg pain or swelling, or hemoptysis.  He does not have a history of COPD but does have an inhaler at home that he uses as needed.  Has not used his inhaler.  Past Medical History:  Diagnosis Date  . Allergy   . Arthritis   . Chronic low back pain    Present for 8-9 years  . GERD (gastroesophageal reflux disease)   . Glaucoma    Follows at the [place]  . Glaucoma   . Hyperlipidemia   . Hypertension   . Shortness of breath    started Albuterol    Patient Active Problem List   Diagnosis Date Noted  . Chronic shoulder pain (Secondary Area of pain) (Right) 04/25/2017  . Lumbar facet syndrome (Bilateral) (L>R) 04/25/2017  . Muscle cramps at night 04/25/2017  . Musculoskeletal pain 04/25/2017  . Neurogenic pain 04/25/2017  . Night muscle spasms 04/25/2017  . Rotator cuff arthropathy (Right) 04/25/2017  . Osteoarthritis of shoulder (Right) 04/25/2017  . Osteoarthritis of AC (acromioclavicular) joint (Right) 04/25/2017  . Long term current use of opiate analgesic 03/23/2017  . Chronic pain syndrome 03/23/2017  . Long term prescription opiate use 03/23/2017  . Opiate use 03/23/2017  . Chronic low back pain  (Primary Area of Pain) (Bilateral) (L>R) 03/23/2017  . Encounter for Medicare annual wellness exam 01/09/2017  . Screening for colon cancer 01/09/2017  . Vitamin D deficiency 12/31/2015  . Annual physical exam 12/25/2015  . Encounter to establish care with new doctor 12/11/2015  . Hypertension 12/10/2015  . Hyperlipidemia 12/10/2015    Past Surgical History:  Procedure Laterality Date  . CATARACT EXTRACTION Bilateral 2010    Prior to Admission medications   Medication Sig Start Date End Date Taking? Authorizing Provider  albuterol (PROVENTIL HFA;VENTOLIN HFA) 108 (90 Base) MCG/ACT inhaler Inhale 2 puffs into the lungs every 6 (six) hours as needed for wheezing or shortness of breath. 09/02/18   Nita Sickle, MD  benzonatate (TESSALON PERLES) 100 MG capsule Take 2 capsules (200 mg total) by mouth 2 (two) times daily as needed for cough. 08/31/18   Poulose, Percell Belt, NP  brimonidine (ALPHAGAN) 0.2 % ophthalmic solution Place 1 drop into both eyes 3 (three) times daily.    [provider]  Calcium Carb-Cholecalciferol (CALCIUM PLUS D3 ABSORBABLE) [phone] MG-UNIT CAPS Take 1 capsule by mouth daily with breakfast. 04/25/17 07/24/17  Delano Metz, MD  Cyanocobalamin (VITAMIN B 12 PO) Take 1 tablet by mouth daily.     [provider]  dorzolamide-timolol (COSOPT) 22.3-6.8 MG/ML ophthalmic solution Place 1 drop into both eyes 2 (two) times daily.  09/16/15   [provider]  latanoprost (XALATAN) 0.005 % ophthalmic solution Place 1 drop into both eyes  at bedtime.  09/16/15   [provider]  lisinopril (PRINIVIL,ZESTRIL) 20 MG tablet Take 1 tablet by mouth daily. 09/24/15   [provider]  meloxicam (MOBIC) 7.5 MG tablet  07/29/18   [provider]  omeprazole (PRILOSEC) 20 MG capsule Take 2 capsules by mouth daily. 09/16/15   [provider]  oxyCODONE-acetaminophen (PERCOCET/ROXICET) 5-325 MG tablet Take 1 tablet by  mouth 2 (two) times daily as needed for severe pain.     [provider]  pravastatin (PRAVACHOL) 80 MG tablet Take 1 tablet by mouth daily. 09/24/15   [provider]  predniSONE (DELTASONE) 20 MG tablet Take 3 tablets (60 mg total) by mouth daily for 4 days. 09/02/18 09/06/18  Nita Sickle, MD    Allergies Patient has no known allergies.  Family History  Problem Relation Age of Onset  . Alzheimer's disease Mother   . Alzheimer's disease Father   . Heart disease Father   . Hypertension Sister   . Healthy Brother     Social History Social History   Tobacco Use  . Smoking status: Former Smoker    Packs/day: 0.50    Years: 20.00    Pack years: 10.00    Types: Cigarettes    Last attempt to quit: 1978    Years since quitting: 41.9  . Smokeless tobacco: Never Used  . Tobacco comment: smoking cessation materials not required  Substance Use Topics  . Alcohol use: No    Alcohol/week: 0.0 standard drinks  . Drug use: No    Review of Systems  Constitutional: Negative for fever. Eyes: Negative for visual changes. ENT: Negative for sore throat. Neck: No neck pain  Cardiovascular: Negative for chest pain. Respiratory: Negative for shortness of breath. + cough Gastrointestinal: Negative for abdominal pain, vomiting or diarrhea. Genitourinary: Negative for dysuria. Musculoskeletal: Negative for back pain. Skin: Negative for rash. Neurological: Negative for headaches, weakness or numbness. Psych: No SI or HI  ____________________________________________   PHYSICAL EXAM:  VITAL SIGNS: ED Triage Vitals  Enc Vitals Group     BP 09/02/18 2112 (!) 146/80     Pulse Rate 09/02/18 2112 62     Resp 09/02/18 2112 18     Temp 09/02/18 2112 98.6 F (37 C)     Temp Source 09/02/18 2112 Oral     SpO2 09/02/18 2112 97 %     Weight 09/02/18 2115 165 lb (74.8 kg)     Height 09/02/18 2115 5\' 7"  (1.702 m)     Head Circumference --      Peak Flow --      Pain  Score 09/02/18 2115 0     Pain Loc --      Pain Edu? --      Excl. in GC? --     Constitutional: Alert and oriented. Well appearing and in no apparent distress. HEENT:      Head: Normocephalic and atraumatic.         Eyes: Conjunctivae are normal. Sclera is non-icteric.       Mouth/Throat: Mucous membranes are moist.       Neck: Supple with no signs of meningismus. Cardiovascular: Regular rate and rhythm. No murmurs, gallops, or rubs. 2+ symmetrical distal pulses are present in all extremities. No JVD. Respiratory: Normal respiratory effort. Lungs are clear to auscultation bilaterally. No wheezes, crackles, or rhonchi.  Gastrointestinal: Soft, non tender, and non distended with positive bowel sounds. No rebound or guarding. Musculoskeletal: Nontender with normal range of  motion in all extremities. No edema, cyanosis, or erythema of extremities. Neurologic: Normal speech and language. Face is symmetric. Moving all extremities. No gross focal neurologic deficits are appreciated. Skin: Skin is warm, dry and intact. No rash noted. Psychiatric: Mood and affect are normal. Speech and behavior are normal.  ____________________________________________   LABS (all labs ordered are listed, but only abnormal results are displayed)  Labs Reviewed  BASIC METABOLIC PANEL - Abnormal; Notable for the following components:      Result Value   Glucose, Bld 117 (*)    Creatinine, Ser 1.55 (*)    Calcium 8.6 (*)    GFR calc non Af Amer 40 (*)    GFR calc Af Amer 47 (*)    All other components within normal limits  CBC - Abnormal; Notable for the following components:   Platelets 148 (*)    All other components within normal limits  TROPONIN I   ____________________________________________  EKG  ED ECG REPORT I, Nita Sickle, the attending physician, personally viewed and interpreted this ECG.   Sinus bradycardia, rate of 51, normal intervals, normal axis, no ST elevations or  depressions.  Normal EKG. ____________________________________________  RADIOLOGY  I have personally reviewed the images performed during this visit and I agree with the Radiologist's read.   Interpretation by Radiologist:  Dg Chest 2 View  Result Date: 09/02/2018 CLINICAL DATA:  Shortness of breath EXAM: CHEST - 2 VIEW COMPARISON:  None. FINDINGS: The heart size and mediastinal contours are within normal limits. Both lungs are clear. The visualized skeletal structures are unremarkable. IMPRESSION: No active cardiopulmonary disease. Electronically Signed   By: Deatra Robinson M.D.   On: 09/02/2018 22:21     ____________________________________________   PROCEDURES  Procedure(s) performed: None Procedures Critical Care performed:  None ____________________________________________   INITIAL IMPRESSION / ASSESSMENT AND PLAN / ED COURSE  83 y.o. male history of hypertension, hyperlipidemia, former smoker who presents for evaluation of cough.  Patient is extremely well-appearing, normal work of breathing, normal sats, lungs are clear to auscultation, labs with no leukocytosis, chest x-ray with no evidence of pneumonia, pulmonary edema, pneumothorax.  No risk factors for PE with no tachypnea, tachycardia, hypoxia.  Presentation concerning for a viral URI versus bronchitis.  Patient was given 1 DuoNeb and prednisone.  Patient was discharged home on prednisone and albuterol.  Recommended follow-up with primary care doctor.  Discussed return precautions for chest pain, shortness of breath or fever.      As part of my medical decision making, I reviewed the following data within the electronic MEDICAL RECORD NUMBER Nursing notes reviewed and incorporated, Labs reviewed , EKG interpreted , Old chart reviewed, Radiograph reviewed , Notes from prior ED visits and [place] Controlled Substance Database    Pertinent labs & imaging results that were available during my care of the patient were reviewed by me  and considered in my medical decision making (see chart for details).    ____________________________________________   FINAL CLINICAL IMPRESSION(S) / ED DIAGNOSES  Final diagnoses:  Bronchitis      NEW MEDICATIONS STARTED DURING THIS VISIT:  ED Discharge Orders         Ordered    predniSONE (DELTASONE) 20 MG tablet  Daily     09/02/18 2301    albuterol (PROVENTIL HFA;VENTOLIN HFA) 108 (90 Base) MCG/ACT inhaler  Every 6 hours PRN     09/02/18 2301           Note:  This document was  prepared using Conservation officer, historic buildings and may include unintentional dictation errors.    Nita Sickle, MD 09/02/18 [phone]

## 2018-09-02 NOTE — ED Triage Notes (Signed)
Patient with complaint of cough and shortness of breath. Patient states that he was seen by his PCP on Friday and started on tessalon pearls. Patient states that the symptoms became worse tonight. Patient denies fever.

## 2018-11-30 ENCOUNTER — Ambulatory Visit (INDEPENDENT_AMBULATORY_CARE_PROVIDER_SITE_OTHER): Payer: Medicare Other | Admitting: Family Medicine

## 2018-11-30 ENCOUNTER — Encounter: Payer: Self-pay | Admitting: Family Medicine

## 2018-11-30 VITALS — BP 150/80 | HR 95 | Temp 98.5°F | Resp 16 | Ht 66.0 in | Wt 166.0 lb

## 2018-11-30 DIAGNOSIS — D696 Thrombocytopenia, unspecified: Secondary | ICD-10-CM | POA: Diagnosis not present

## 2018-11-30 DIAGNOSIS — M4696 Unspecified inflammatory spondylopathy, lumbar region: Secondary | ICD-10-CM | POA: Diagnosis not present

## 2018-11-30 DIAGNOSIS — N183 Chronic kidney disease, stage 3 unspecified: Secondary | ICD-10-CM | POA: Insufficient documentation

## 2018-11-30 DIAGNOSIS — I10 Essential (primary) hypertension: Secondary | ICD-10-CM | POA: Diagnosis not present

## 2018-11-30 DIAGNOSIS — E7800 Pure hypercholesterolemia, unspecified: Secondary | ICD-10-CM

## 2018-11-30 DIAGNOSIS — J441 Chronic obstructive pulmonary disease with (acute) exacerbation: Secondary | ICD-10-CM

## 2018-11-30 MED ORDER — UMECLIDINIUM-VILANTEROL 62.5-25 MCG/INH IN AEPB: 1.0000 | INHALATION_SPRAY | Freq: Every day | RESPIRATORY_TRACT | 0 refills | Status: DC

## 2018-11-30 MED ORDER — BENZONATATE 100 MG PO CAPS: 100.0000 mg | ORAL_CAPSULE | Freq: Two times a day (BID) | ORAL | 0 refills | Status: DC | PRN

## 2018-11-30 NOTE — Progress Notes (Signed)
Name: Brett Gill   MRN: [id number]    DOB: 1935-07-31   Date:11/30/2018       Progress Note  Subjective  Chief Complaint  Chief Complaint  Patient presents with  . Cough  . Chest congestion    HPI  Chronic bronchitis: he states two years ago developed SOB with mild activity, went to the [place] and was given albuterol and symptoms improved. He has an URI last Fall and developed a productive cough and was given prednisone taper and symptoms improved. He states symptoms started yesterday, no wheezing, but cough is mostly dry but sometimes is productive and clear. No wheezing or SOB, he would like medication before he gets worse. He states last times his chest was sore from coughing so hard. No fever or chills  Thrombocytopenia: labs not repeated, we will check it today, he bruises easily, no blood in urine or stools.   CKI: advised him to stop NSAID"s and we will recheck levels. He denies decrease in urine output or pruritis  Spondylopathy: he goes to the [place] and gets pain medications for chronic back pain but no radiculitis.   HTN: bp is elevated today, he states bp usually 130's/80's, no chest pain or palpitation  Hyperlipidemia: on pravastatin, denies side effects, we will recheck labs   Patient Active Problem List   Diagnosis Date Noted  . Unspecified inflammatory spondylopathy, lumbar region ([place]) 11/30/2018  . Thrombocytopenia ([place]) 11/30/2018  . Stage 3 chronic kidney disease ([place]) 11/30/2018  . Chronic shoulder pain (Secondary Area of pain) (Right) 04/25/2017  . Lumbar facet syndrome (Bilateral) (L>R) 04/25/2017  . Musculoskeletal pain 04/25/2017  . Neurogenic pain 04/25/2017  . Rotator cuff arthropathy (Right) 04/25/2017  . Osteoarthritis of shoulder (Right) 04/25/2017  . Osteoarthritis of AC (acromioclavicular) joint (Right) 04/25/2017  . Long term current use of opiate analgesic 03/23/2017  . Chronic pain syndrome 03/23/2017  . Long term prescription opiate use  03/23/2017  . Opiate use 03/23/2017  . Chronic low back pain (Primary Area of Pain) (Bilateral) (L>R) 03/23/2017  . Screening for colon cancer 01/09/2017  . Vitamin D deficiency 12/31/2015  . Hypertension 12/10/2015  . Hyperlipidemia 12/10/2015    Past Surgical History:  Procedure Laterality Date  . CATARACT EXTRACTION Bilateral 2010    Family History  Problem Relation Age of Onset  . Alzheimer's disease Mother   . Alzheimer's disease Father   . Heart disease Father   . Hypertension Sister   . Healthy Brother     Social History   Socioeconomic History  . Marital status: Divorced    Spouse name: Not on file  . Number of children: 1  . Years of education: Not on file  . Highest education level: 11th grade  Occupational History  . Occupation: Retired  Scientific laboratory technician  . Financial resource strain: Not hard at all  . Food insecurity:    Worry: Never true    Inability: Never true  . Transportation needs:    Medical: No    Non-medical: No  Tobacco Use  . Smoking status: Former Smoker    Packs/day: 0.50    Years: 20.00    Pack years: 10.00    Types: Cigarettes    Last attempt to quit: 1978    Years since quitting: 42.1  . Smokeless tobacco: Never Used  Substance and Sexual Activity  . Alcohol use: No    Alcohol/week: 0.0 standard drinks  . Drug use: No  . Sexual activity: Yes  Lifestyle  .  Physical activity:    Days per week: 0 days    Minutes per session: 0 min  . Stress: Not at all  Relationships  . Social connections:    Talks on phone: Patient refused    Gets together: Patient refused    Attends religious service: Patient refused    Active member of club or organization: Patient refused    Attends meetings of clubs or organizations: Patient refused    Relationship status: Divorced  . Intimate partner violence:    Fear of current or ex partner: No    Emotionally abused: No    Physically abused: No    Forced sexual activity: No  Other Topics Concern  .  Not on file  Social History Narrative   Lives alone, has a dog, he has [employer] benefits     Current Outpatient Medications:  .  albuterol (PROVENTIL HFA;VENTOLIN HFA) 108 (90 Base) MCG/ACT inhaler, Inhale 2 puffs into the lungs every 6 (six) hours as needed for wheezing or shortness of breath., Disp: 1 Inhaler, Rfl: 2 .  benzonatate (TESSALON PERLES) 100 MG capsule, Take 2 capsules (200 mg total) by mouth 2 (two) times daily as needed for cough., Disp: 30 capsule, Rfl: 0 .  brimonidine (ALPHAGAN) 0.2 % ophthalmic solution, Place 1 drop into both eyes 3 (three) times daily., Disp: , Rfl:  .  Cyanocobalamin (VITAMIN B 12 PO), Take 1 tablet by mouth daily. , Disp: , Rfl:  .  dorzolamide-timolol (COSOPT) 22.3-6.8 MG/ML ophthalmic solution, Place 1 drop into both eyes 2 (two) times daily. , Disp: , Rfl:  .  latanoprost (XALATAN) 0.005 % ophthalmic solution, Place 1 drop into both eyes at bedtime. , Disp: , Rfl:  .  lisinopril (PRINIVIL,ZESTRIL) 20 MG tablet, Take 1 tablet by mouth daily., Disp: , Rfl:  .  omeprazole (PRILOSEC) 20 MG capsule, Take 2 capsules by mouth daily., Disp: , Rfl:  .  oxyCODONE-acetaminophen (PERCOCET/ROXICET) 5-325 MG tablet, Take 1 tablet by mouth 2 (two) times daily as needed for severe pain. , Disp: , Rfl:  .  pravastatin (PRAVACHOL) 80 MG tablet, Take 1 tablet by mouth daily., Disp: , Rfl:  .  Calcium Carb-Cholecalciferol (CALCIUM PLUS D3 ABSORBABLE) [phone] MG-UNIT CAPS, Take 1 capsule by mouth daily with breakfast., Disp: 90 capsule, Rfl: 0  No Known Allergies  I personally reviewed active problem list, medication list, allergies, family history, social history with the patient/caregiver today.   ROS  Constitutional: Negative for fever or weight change.  Respiratory: Positive for cough and now SOB .   Cardiovascular: Negative for chest pain or palpitations.  Gastrointestinal: Negative for abdominal pain, no bowel changes.  Musculoskeletal: Negative for gait problem  or joint swelling.  Skin: Negative for rash.  Neurological: Negative for dizziness or headache.  No other specific complaints in a complete review of systems (except as listed in HPI above).  Objective  Vitals:   11/30/18 1151 11/30/18 1153  BP: (!) 150/90 (!) 150/80  Pulse: 95   Temp: 98.5 F (36.9 C)   TempSrc: Oral   Weight: 166 lb (75.3 kg)     Body mass index is 26 kg/m.  Physical Exam  Constitutional: Patient appears well-developed and well-nourished. No distress.  HEENT: head atraumatic, normocephalic, pupils equal and reactive to light,  neck supple, throat within normal limits Cardiovascular: Normal rate, regular rhythm and normal heart sounds.  No murmur heard. No BLE edema. Pulmonary/Chest: Effort normal , bilateral rhonchi no wheezing or crackles.  No  respiratory distress. Abdominal: Soft.  There is no tenderness. Psychiatric: Patient has a normal mood and affect. behavior is normal. Judgment and thought content normal.  Recent Results (from the past 2160 hour(s))  Basic metabolic panel     Status: Abnormal   Collection Time: 09/02/18  9:18 PM  Result Value Ref Range   Sodium 139 135 - 145 mmol/L   Potassium 4.5 3.5 - 5.1 mmol/L   Chloride 110 98 - 111 mmol/L   CO2 23 22 - 32 mmol/L   Glucose, Bld 117 (H) 70 - 99 mg/dL   BUN 23 8 - 23 mg/dL   Creatinine, Ser 1.55 (H) 0.61 - 1.24 mg/dL   Calcium 8.6 (L) 8.9 - 10.3 mg/dL   GFR calc non Af Amer 40 (L) >60 mL/min   GFR calc Af Amer 47 (L) >60 mL/min    Comment: (NOTE) The eGFR has been calculated using the CKD EPI equation. This calculation has not been validated in all clinical situations. eGFR's persistently <60 mL/min signify possible Chronic Kidney Disease.    Anion gap 6 5 - 15    Comment: Performed at [facility], [address] [place], [place] [address]  CBC     Status: Abnormal   Collection Time: 09/02/18  9:18 PM  Result Value Ref Range   WBC 7.4 4.0 - 10.5 K/uL   RBC 4.29 4.22 -  5.81 MIL/uL   Hemoglobin 13.6 13.0 - 17.0 g/dL   HCT 41.3 39.0 - 52.0 %   MCV 96.3 80.0 - 100.0 fL   MCH 31.7 26.0 - 34.0 pg   MCHC 32.9 30.0 - 36.0 g/dL   RDW 12.6 11.5 - 15.5 %   Platelets 148 (L) 150 - 400 K/uL   nRBC 0.0 0.0 - 0.2 %    Comment: Performed at [facility], [address] [place], [place] [address]  Troponin I - ONCE - STAT     Status: None   Collection Time: 09/02/18  9:18 PM  Result Value Ref Range   Troponin I <0.03 <0.03 ng/mL    Comment: Performed at [facility], [address] [place], [place] [address]     PHQ2/9: Depression screen [facility] 2/9 11/30/2018 12/08/2017 04/25/2017 03/23/2017 01/09/2017  Decreased Interest 0 0 0 0 0  Down, Depressed, Hopeless 0 0 0 0 0  PHQ - 2 Score 0 0 0 0 0  Altered sleeping - 0 - - -  Tired, decreased energy - 0 - - -  Change in appetite - 0 - - -  Feeling bad or failure about yourself  - 0 - - -  Trouble concentrating - 0 - - -  Moving slowly or fidgety/restless - 0 - - -  Suicidal thoughts - 0 - - -  PHQ-9 Score - 0 - - -  Difficult doing work/chores - Not difficult at all - - -     Fall Risk: Fall Risk  11/30/2018 08/31/2018 12/08/2017 04/25/2017 03/23/2017  Falls in the past year? 0 0 No No No  Number falls in past yr: 0 - - - -  Injury with Fall? 0 - - - -     Assessment & Plan  1. Bronchitis, chronic obstructive, with exacerbation (HCC)  - umeclidinium-vilanterol (ANORO ELLIPTA) 62.5-25 MCG/INH AEPB; Inhale 1 puff into the lungs daily.  Dispense: 60 each; Refill: 0 - benzonatate (TESSALON PERLES) 100 MG capsule; Take 1-2 capsules (100-200 mg total) by mouth 2 (two) times daily as  needed for cough.  Dispense: 60 capsule; Refill: 0  2. Unspecified inflammatory spondylopathy, lumbar region ([place])   3. Thrombocytopenia (HCC)  - CBC with Differential/Platelet  4. Stage 3 chronic kidney disease (HCC)  - CBC with Differential/Platelet - COMPLETE METABOLIC PANEL WITH GFR  5. Pure  hypercholesterolemia  - Lipid panel  6. Essential hypertension  - CBC with Differential/Platelet - COMPLETE METABOLIC PANEL WITH GFR

## 2018-12-01 LAB — CBC WITH DIFFERENTIAL/PLATELET
Absolute Monocytes: 479 cells/uL (ref 200–950)
Basophils Absolute: 10 cells/uL (ref 0–200)
Basophils Relative: 0.2 %
EOS PCT: 2 %
Eosinophils Absolute: 102 cells/uL (ref 15–500)
HEMATOCRIT: 38.8 % (ref 38.5–50.0)
HEMOGLOBIN: 13.5 g/dL (ref 13.2–17.1)
LYMPHS ABS: 530 {cells}/uL — AB (ref 850–3900)
MCH: 32.6 pg (ref 27.0–33.0)
MCHC: 34.8 g/dL (ref 32.0–36.0)
MCV: 93.7 fL (ref 80.0–100.0)
MPV: 10.6 fL (ref 7.5–12.5)
Monocytes Relative: 9.4 %
NEUTROS ABS: 3978 {cells}/uL (ref 1500–7800)
NEUTROS PCT: 78 %
Platelets: 134 10*3/uL — ABNORMAL LOW (ref 140–400)
RBC: 4.14 10*6/uL — AB (ref 4.20–5.80)
RDW: 12.6 % (ref 11.0–15.0)
Total Lymphocyte: 10.4 %
WBC: 5.1 10*3/uL (ref 3.8–10.8)

## 2018-12-01 LAB — COMPLETE METABOLIC PANEL WITH GFR
AG Ratio: 2 (calc) (ref 1.0–2.5)
ALT: 15 U/L (ref 9–46)
AST: 15 U/L (ref 10–35)
Albumin: 4.2 g/dL (ref 3.6–5.1)
Alkaline phosphatase (APISO): 57 U/L (ref 35–144)
BUN/Creatinine Ratio: 12 (calc) (ref 6–22)
BUN: 18 mg/dL (ref 7–25)
CALCIUM: 9.2 mg/dL (ref 8.6–10.3)
CO2: 24 mmol/L (ref 20–32)
CREATININE: 1.46 mg/dL — AB (ref 0.70–1.11)
Chloride: 110 mmol/L (ref 98–110)
GFR, EST NON AFRICAN AMERICAN: 44 mL/min/{1.73_m2} — AB (ref >=60)
GFR, Est African American: 52 mL/min/{1.73_m2} — ABNORMAL LOW (ref >=60)
GLOBULIN: 2.1 g/dL (ref 1.9–3.7)
Glucose, Bld: 106 mg/dL — ABNORMAL HIGH (ref 65–99)
Potassium: 4.2 mmol/L (ref 3.5–5.3)
SODIUM: 142 mmol/L (ref 135–146)
Total Bilirubin: 0.8 mg/dL (ref 0.2–1.2)
Total Protein: 6.3 g/dL (ref 6.1–8.1)

## 2018-12-01 LAB — LIPID PANEL
CHOL/HDL RATIO: 4.2 (calc) (ref <5.0)
Cholesterol: 137 mg/dL (ref <200)
HDL: 33 mg/dL — AB (ref >=40)
LDL CHOLESTEROL (CALC): 70 mg/dL
NON-HDL CHOLESTEROL (CALC): 104 mg/dL (ref <130)
TRIGLYCERIDES: 242 mg/dL — AB (ref <150)

## 2018-12-05 ENCOUNTER — Telehealth: Payer: Self-pay | Admitting: Family Medicine

## 2018-12-05 DIAGNOSIS — J441 Chronic obstructive pulmonary disease with (acute) exacerbation: Secondary | ICD-10-CM

## 2018-12-05 DIAGNOSIS — R05 Cough: Secondary | ICD-10-CM

## 2018-12-05 DIAGNOSIS — R058 Other specified cough: Secondary | ICD-10-CM

## 2018-12-05 NOTE — Telephone Encounter (Signed)
Informed patient Dr. Carlynn Purl ordered him a chest x-ray and needs to have one done before an antibiotic can be sent in.

## 2018-12-05 NOTE — Telephone Encounter (Signed)
Call patient , he needs to go [facility] to get CXR across the street and I will send antibiotics once done

## 2018-12-05 NOTE — Telephone Encounter (Addendum)
Copied from CRM [id number]. Topic: General - Other >> Dec 05, 2018  7:31 AM Leafy Ro wrote: Reason for CRM:pt saw dr Carlynn Purl on 11-30-2018. Pt is still taking tessalon perles. Pt still has the cough with yellowish mucus. Pt is coughing so much its cause his chest to hurt. [facility] in [place]

## 2018-12-06 ENCOUNTER — Other Ambulatory Visit: Payer: Self-pay | Admitting: Family Medicine

## 2018-12-06 ENCOUNTER — Ambulatory Visit
Admission: RE | Admit: 2018-12-06 | Discharge: 2018-12-06 | Disposition: A | Discharge status: Home / Self Care | Payer: Medicare Other | Attending: Family Medicine | Admitting: Family Medicine

## 2018-12-06 ENCOUNTER — Ambulatory Visit
Admission: RE | Admit: 2018-12-06 | Discharge: 2018-12-06 | Disposition: A | Discharge status: Home / Self Care | Payer: Medicare Other | Source: Ambulatory Visit | Attending: Family Medicine | Admitting: Family Medicine

## 2018-12-06 DIAGNOSIS — R058 Other specified cough: Secondary | ICD-10-CM

## 2018-12-06 DIAGNOSIS — R079 Chest pain, unspecified: Secondary | ICD-10-CM | POA: Diagnosis not present

## 2018-12-06 DIAGNOSIS — R05 Cough: Secondary | ICD-10-CM | POA: Insufficient documentation

## 2018-12-06 MED ORDER — PREDNISONE 10 MG PO TABS: 20.0000 mg | ORAL_TABLET | Freq: Two times a day (BID) | ORAL | 0 refills | Status: DC

## 2018-12-11 ENCOUNTER — Ambulatory Visit (INDEPENDENT_AMBULATORY_CARE_PROVIDER_SITE_OTHER): Payer: Medicare Other

## 2018-12-11 VITALS — BP 138/72 | HR 52 | Temp 98.3°F | Resp 16 | Ht 66.0 in | Wt 165.6 lb

## 2018-12-11 DIAGNOSIS — Z0000 Encounter for general adult medical examination without abnormal findings: Secondary | ICD-10-CM | POA: Diagnosis not present

## 2018-12-11 NOTE — Patient Instructions (Signed)
[name] , Thank you for taking time to come for your Medicare Wellness Visit. I appreciate your ongoing commitment to your health goals. Please review the following plan we discussed and let me know if I can assist you in the future.   Screening recommendations/referrals: Colonoscopy: done at [facility]. No longer required. Cologuard completed 02/06/17. Recommended yearly ophthalmology/optometry visit for glaucoma screening and checkup Recommended yearly dental visit for hygiene and checkup  Vaccinations: Influenza vaccine: done 08/03/18 Pneumococcal vaccine: done 12/08/17 due for second dose Tdap vaccine: due - please contact us if you get a cut or scrape Shingles vaccine: Shingrix discussed. Please contact your pharmacy for coverage information.   Advanced directives: Advance directive discussed with you today. Even though you declined this today please call our office should you change your mind and we can give you the proper paperwork for you to fill out.  Conditions/risks identified: Keep up the great work!  Next appointment: Please follow up in one year for your Medicare Annual Wellness visit.    Preventive Care 70 Years and Older, Male Preventive care refers to lifestyle choices and visits with your health care provider that can promote health and wellness. What does preventive care include?  A yearly physical exam. This is also called an annual well check.  Dental exams once or twice a year.  Routine eye exams. Ask your health care provider how often you should have your eyes checked.  Personal lifestyle choices, including:  Daily care of your teeth and gums.  Regular physical activity.  Eating a healthy diet.  Avoiding tobacco and drug use.  Limiting alcohol use.  Practicing safe sex.  Taking low doses of aspirin every day.  Taking vitamin and mineral supplements as recommended by your health care provider. What happens during an annual well check? The  services and screenings done by your health care provider during your annual well check will depend on your age, overall health, lifestyle risk factors, and family history of disease. Counseling  Your health care provider may ask you questions about your:  Alcohol use.  Tobacco use.  Drug use.  Emotional well-being.  Home and relationship well-being.  Sexual activity.  Eating habits.  History of falls.  Memory and ability to understand (cognition).  Work and work Astronomer. Screening  You may have the following tests or measurements:  Height, weight, and BMI.  Blood pressure.  Lipid and cholesterol levels. These may be checked every 5 years, or more frequently if you are over 36 years old.  Skin check.  Lung cancer screening. You may have this screening every year starting at age 24 if you have a 30-pack-year history of smoking and currently smoke or have quit within the past 15 years.  Fecal occult blood test (FOBT) of the stool. You may have this test every year starting at age 84.  Flexible sigmoidoscopy or colonoscopy. You may have a sigmoidoscopy every 5 years or a colonoscopy every 10 years starting at age 60.  Prostate cancer screening. Recommendations will vary depending on your family history and other risks.  Hepatitis C blood test.  Hepatitis B blood test.  Sexually transmitted disease (STD) testing.  Diabetes screening. This is done by checking your blood sugar (glucose) after you have not eaten for a while (fasting). You may have this done every 1-3 years.  Abdominal aortic aneurysm (AAA) screening. You may need this if you are a current or former smoker.  Osteoporosis. You may be screened starting at age  82 if you are at high risk. Talk with your health care provider about your test results, treatment options, and if necessary, the need for more tests. Vaccines  Your health care provider may recommend certain vaccines, such as:  Influenza  vaccine. This is recommended every year.  Tetanus, diphtheria, and acellular pertussis (Tdap, Td) vaccine. You may need a Td booster every 10 years.  Zoster vaccine. You may need this after age 10.  Pneumococcal 13-valent conjugate (PCV13) vaccine. One dose is recommended after age 69.  Pneumococcal polysaccharide (PPSV23) vaccine. One dose is recommended after age 22. Talk to your health care provider about which screenings and vaccines you need and how often you need them. This information is not intended to replace advice given to you by your health care provider. Make sure you discuss any questions you have with your health care provider. Document Released: 10/23/2015 Document Revised: 06/15/2016 Document Reviewed: 07/28/2015 Elsevier Interactive Patient Education  2017 [place] Prevention in the Home Falls can cause injuries. They can happen to people of all ages. There are many things you can do to make your home safe and to help prevent falls. What can I do on the outside of my home?  Regularly fix the edges of walkways and driveways and fix any cracks.  Remove anything that might make you trip as you walk through a door, such as a raised step or threshold.  Trim any bushes or trees on the path to your home.  Use bright outdoor lighting.  Clear any walking paths of anything that might make someone trip, such as rocks or tools.  Regularly check to see if handrails are loose or broken. Make sure that both sides of any steps have handrails.  Any raised decks and porches should have guardrails on the edges.  Have any leaves, snow, or ice cleared regularly.  Use sand or salt on walking paths during winter.  Clean up any spills in your garage right away. This includes oil or grease spills. What can I do in the bathroom?  Use night lights.  Install grab bars by the toilet and in the tub and shower. Do not use towel bars as grab bars.  Use non-skid mats or decals  in the tub or shower.  If you need to sit down in the shower, use a plastic, non-slip stool.  Keep the floor dry. Clean up any water that spills on the floor as soon as it happens.  Remove soap buildup in the tub or shower regularly.  Attach bath mats securely with double-sided non-slip rug tape.  Do not have throw rugs and other things on the floor that can make you trip. What can I do in the bedroom?  Use night lights.  Make sure that you have a light by your bed that is easy to reach.  Do not use any sheets or blankets that are too big for your bed. They should not hang down onto the floor.  Have a firm chair that has side arms. You can use this for support while you get dressed.  Do not have throw rugs and other things on the floor that can make you trip. What can I do in the kitchen?  Clean up any spills right away.  Avoid walking on wet floors.  Keep items that you use a lot in easy-to-reach places.  If you need to reach something above you, use a strong step stool that has a grab bar.  Keep  electrical cords out of the way.  Do not use floor polish or wax that makes floors slippery. If you must use wax, use non-skid floor wax.  Do not have throw rugs and other things on the floor that can make you trip. What can I do with my stairs?  Do not leave any items on the stairs.  Make sure that there are handrails on both sides of the stairs and use them. Fix handrails that are broken or loose. Make sure that handrails are as long as the stairways.  Check any carpeting to make sure that it is firmly attached to the stairs. Fix any carpet that is loose or worn.  Avoid having throw rugs at the top or bottom of the stairs. If you do have throw rugs, attach them to the floor with carpet tape.  Make sure that you have a light switch at the top of the stairs and the bottom of the stairs. If you do not have them, ask someone to add them for you. What else can I do to help  prevent falls?  Wear shoes that:  Do not have high heels.  Have rubber bottoms.  Are comfortable and fit you well.  Are closed at the toe. Do not wear sandals.  If you use a stepladder:  Make sure that it is fully opened. Do not climb a closed stepladder.  Make sure that both sides of the stepladder are locked into place.  Ask someone to hold it for you, if possible.  Clearly mark and make sure that you can see:  Any grab bars or handrails.  First and last steps.  Where the edge of each step is.  Use tools that help you move around (mobility aids) if they are needed. These include:  Canes.  Walkers.  Scooters.  Crutches.  Turn on the lights when you go into a dark area. Replace any light bulbs as soon as they burn out.  Set up your furniture so you have a clear path. Avoid moving your furniture around.  If any of your floors are uneven, fix them.  If there are any pets around you, be aware of where they are.  Review your medicines with your doctor. Some medicines can make you feel dizzy. This can increase your chance of falling. Ask your doctor what other things that you can do to help prevent falls. This information is not intended to replace advice given to you by your health care provider. Make sure you discuss any questions you have with your health care provider. Document Released: 07/23/2009 Document Revised: 03/03/2016 Document Reviewed: 10/31/2014 Elsevier Interactive Patient Education  2017 [employer].

## 2018-12-11 NOTE — Progress Notes (Signed)
Subjective:   Brett Gill is a 84 y.o. male who presents for Medicare Annual/Subsequent preventive examination.  Review of Systems:   Cardiac Risk Factors include: advanced age (>18men, >51 women);dyslipidemia;hypertension;male gender     Objective:    Vitals: BP 138/72 (BP Location: Left Arm, Patient Position: Sitting, Cuff Size: Normal)   Pulse (!) 52   Temp 98.3 F (36.8 C) (Oral)   Resp 16   Ht 5\' 6"  (1.676 m)   Wt 165 lb 9.6 oz (75.1 kg)   SpO2 97%   BMI 26.73 kg/m   Body mass index is 26.73 kg/m.  Advanced Directives 12/11/2018 09/02/2018 12/08/2017 04/25/2017 03/23/2017 01/09/2017 12/25/2015  Does Patient Have a Medical Advance Directive? No No No No No No No  Would patient like information on creating a medical advance directive? No - Patient declined - Yes (MAU/Ambulatory/Procedural Areas - Information given) No - Patient declined - No - Patient declined No - patient declined information    Tobacco Social History   Tobacco Use  Smoking Status Former Smoker  . Packs/day: 0.50  . Years: 20.00  . Pack years: 10.00  . Types: Cigarettes  . Last attempt to quit: 1978  . Years since quitting: 42.1  Smokeless Tobacco Never Used     Counseling given: Not Answered   Clinical Intake:  Pre-visit preparation completed: Yes  Pain : No/denies pain     BMI - recorded: 26.73 Nutritional Status: BMI 25 -29 Overweight Nutritional Risks: None Diabetes: No  How often do you need to have someone help you when you read instructions, pamphlets, or other written materials from your doctor or pharmacy?: 1 - Never What is the last grade level you completed in school?: 11th grade  Interpreter Needed?: No  Information entered by :: Brett Littler LPN  Past Medical History:  Diagnosis Date  . Allergy   . Arthritis   . Chronic low back pain    Present for 8-9 years  . GERD (gastroesophageal reflux disease)   . Glaucoma    Follows at the [place]  . Glaucoma   . Hyperlipidemia    . Hypertension   . Shortness of breath    started Albuterol   Past Surgical History:  Procedure Laterality Date  . CATARACT EXTRACTION Bilateral 2010   Family History  Problem Relation Age of Onset  . Alzheimer's disease Mother   . Alzheimer's disease Father   . Heart disease Father   . Hypertension Sister   . Healthy Brother    Social History   Socioeconomic History  . Marital status: Divorced    Spouse name: Not on file  . Number of children: 1  . Years of education: Not on file  . Highest education level: 11th grade  Occupational History  . Occupation: Retired  Engineer, production  . Financial resource strain: Not hard at all  . Food insecurity:    Worry: Never true    Inability: Never true  . Transportation needs:    Medical: No    Non-medical: No  Tobacco Use  . Smoking status: Former Smoker    Packs/day: 0.50    Years: 20.00    Pack years: 10.00    Types: Cigarettes    Last attempt to quit: 1978    Years since quitting: 42.1  . Smokeless tobacco: Never Used  Substance and Sexual Activity  . Alcohol use: No    Alcohol/week: 0.0 standard drinks  . Drug use: No  . Sexual activity: Yes  Lifestyle  . Physical activity:    Days per week: 3 days    Minutes per session: 60 min  . Stress: Not at all  Relationships  . Social connections:    Talks on phone: More than three times a week    Gets together: More than three times a week    Attends religious service: 1 to 4 times per year    Active member of club or organization: No    Attends meetings of clubs or organizations: Never    Relationship status: Divorced  Other Topics Concern  . Not on file  Social History Narrative   Lives alone, has a dog, he has [employer] benefits    Outpatient Encounter Medications as of 12/11/2018  Medication Sig  . albuterol (PROVENTIL HFA;VENTOLIN HFA) 108 (90 Base) MCG/ACT inhaler Inhale 2 puffs into the lungs every 6 (six) hours as needed for wheezing or shortness of breath.  .  benzonatate (TESSALON PERLES) 100 MG capsule Take 1-2 capsules (100-200 mg total) by mouth 2 (two) times daily as needed for cough.  . brimonidine (ALPHAGAN) 0.2 % ophthalmic solution Place 1 drop into both eyes 3 (three) times daily.  . Cyanocobalamin (VITAMIN B 12 PO) Take 1 tablet by mouth daily.   . dorzolamide-timolol (COSOPT) 22.3-6.8 MG/ML ophthalmic solution Place 1 drop into both eyes 2 (two) times daily.   Marland Kitchen. latanoprost (XALATAN) 0.005 % ophthalmic solution Place 1 drop into both eyes at bedtime.   Marland Kitchen. lisinopril (PRINIVIL,ZESTRIL) 20 MG tablet Take 1 tablet by mouth daily.  Marland Kitchen. omeprazole (PRILOSEC) 20 MG capsule Take 2 capsules by mouth daily.  Marland Kitchen. oxyCODONE-acetaminophen (PERCOCET/ROXICET) 5-325 MG tablet Take 1 tablet by mouth 2 (two) times daily as needed for severe pain.   . pravastatin (PRAVACHOL) 80 MG tablet Take 1 tablet by mouth daily.  Marland Kitchen. umeclidinium-vilanterol (ANORO ELLIPTA) 62.5-25 MCG/INH AEPB Inhale 1 puff into the lungs daily.  . Calcium Carb-Cholecalciferol (CALCIUM PLUS D3 ABSORBABLE) [phone] MG-UNIT CAPS Take 1 capsule by mouth daily with breakfast.  . [DISCONTINUED] predniSONE (DELTASONE) 10 MG tablet Take 2 tablets (20 mg total) by mouth 2 (two) times daily with a meal. No later than 4 pm   No facility-administered encounter medications on file as of 12/11/2018.     Activities of Daily Living In your present state of health, do you have any difficulty performing the following activities: 12/11/2018  Hearing? N  Comment declines hearing aids  Vision? N  Difficulty concentrating or making decisions? N  Walking or climbing stairs? N  Dressing or bathing? N  Doing errands, shopping? N  Preparing Food and eating ? N  Using the Toilet? N  In the past six months, have you accidently leaked urine? N  Do you have problems with loss of bowel control? N  Managing your Medications? N  Managing your Finances? N  Housekeeping or managing your Housekeeping? N  Some recent  data might be hidden    Patient Care Team: Alba CorySowles, Krichna, MD as PCP - General (Family Medicine) Noel GeroldMinton, Robert, [place] as Consulting Physician   Assessment:   This is a routine wellness examination for [place].  Exercise Activities and Dietary recommendations Current Exercise Habits: Home exercise routine, Type of exercise: calisthenics;walking, Time (Minutes): 60, Frequency (Times/Week): 3, Weekly Exercise (Minutes/Week): 180, Intensity: Mild, Exercise limited by: None identified  Goals    . Exercise 150 min/wk Moderate Activity     Recommend to exercise for at least 150 minutes per week.    .Marland Kitchen  Increase water intake     Recommend increasing water intake to 3-4 glasses a day.       Fall Risk Fall Risk  12/11/2018 11/30/2018 08/31/2018 12/08/2017 04/25/2017  Falls in the past year? 0 0 0 No No  Number falls in past yr: 0 0 - - -  Injury with Fall? 0 0 - - -  Follow up Falls prevention discussed - - - -   FALL RISK PREVENTION PERTAINING TO THE HOME:  Any stairs in or around the home? Yes  If so, do they handrails? Yes   Home free of loose throw rugs in walkways, pet beds, electrical cords, etc? Yes  Adequate lighting in your home to reduce risk of falls? Yes   ASSISTIVE DEVICES UTILIZED TO PREVENT FALLS:  Life alert? No  Use of a cane, walker or w/c? No  Grab bars in the bathroom? Yes  Shower chair or bench in shower? No  Elevated toilet seat or a handicapped toilet? No   DME ORDERS:  DME order needed?  No   TIMED UP AND GO:  Was the test performed? Yes .  Length of time to ambulate 10 feet: 5 sec.   GAIT:  Appearance of gait: Gait stead-fast and without the use of an assistive device.   Education: Fall risk prevention has been discussed.  Intervention(s) required? No   Depression Screen PHQ 2/9 Scores 12/11/2018 11/30/2018 12/08/2017 04/25/2017  PHQ - 2 Score 0 0 0 0  PHQ- 9 Score - - 0 -    Cognitive Function     6CIT Screen 12/08/2017 01/09/2017  What Year? 0  points 0 points  What month? 0 points 0 points  What time? 0 points 0 points  Count back from 20 0 points 0 points  Months in reverse 0 points 0 points  Repeat phrase 0 points 0 points  Total Score 0 0    Immunization History  Administered Date(s) Administered  . Influenza-Unspecified 08/11/2015, 07/29/2017  . Pneumococcal Conjugate-13 12/08/2017    Qualifies for Shingles Vaccine? Yes . Due for Shingrix. Education has been provided regarding the importance of this vaccine. Pt has been advised to call insurance company to determine out of pocket expense. Advised may also receive vaccine at local pharmacy or Health Dept. Verbalized acceptance and understanding.  Tdap: Although this vaccine is not a covered service during a Wellness Exam, does the patient still wish to receive this vaccine today?  No .  Education has been provided regarding the importance of this vaccine. Advised may receive this vaccine at local pharmacy or Health Dept. Aware to provide a copy of the vaccination record if obtained from local pharmacy or Health Dept. Verbalized acceptance and understanding.  Flu Vaccine: Up to date  Pneumococcal Vaccine: Due for Pneumococcal vaccine. Does the patient want to receive this vaccine today?  No . Education has been provided regarding the importance of this vaccine but still declined. Advised may receive this vaccine at local pharmacy or Health Dept. Aware to provide a copy of the vaccination record if obtained from local pharmacy or Health Dept. Verbalized acceptance and understanding.   Screening Tests Health Maintenance  Topic Date Due  . PNA vac Low Risk Adult (2 of 2 - PPSV23) 12/09/2018  . TETANUS/TDAP  12/09/2026 (Originally 02/12/1954)  . INFLUENZA VACCINE  Completed   Cancer Screenings:  Colorectal Screening: Completed at the [place]. No longer required. Cologuard negative 02/06/17.  Lung Cancer Screening: (Low Dose CT Chest recommended if Age  55-80 years, 30 pack-year  currently smoking OR have quit w/in 15years.) does not qualify.    Additional Screening:  Hepatitis C Screening: no longer required  Vision Screening: Recommended annual ophthalmology exams for early detection of glaucoma and other disorders of the eye. Is the patient up to date with their annual eye exam?  Yes  Who is the provider or what is the name of the office in which the pt attends annual [facility] exams[place] [facility]  Dental Screening: Recommended annual dental exams for proper oral hygiene  Community Resource Referral:  CRR required this visit?  No       Plan:    I have personally reviewed and addressed the Medicare Annual Wellness questionnaire and have noted the following in the patient's chart:  A. Medical and social history B. Use of alcohol, tobacco or illicit drugs  C. Current medications and supplements D. Functional ability and status E.  Nutritional status F.  Physical activity G. Advance directives H. List of other physicians I.  Hospitalizations, surgeries, and ER visits in previous 12 months J.  Vitals K. Screenings such as hearing and vision if needed, cognitive and depression L. Referrals and appointments   In addition, I have reviewed and discussed with patient certain preventive protocols, quality metrics, and best practice recommendations. A written personalized care plan for preventive services as well as general preventive health recommendations were provided to patient.   Signed,  Brett LittlerKasey Hazle Ogburn, LPN Nurse Health Advisor   Nurse Notes:  Pt doing well and appreciative of visit today. He is feeling much better since last office visit on 11/30/18 when diagnosed with bronchitis.

## 2018-12-25 ENCOUNTER — Telehealth: Payer: Self-pay | Admitting: Family Medicine

## 2018-12-25 NOTE — Telephone Encounter (Signed)
Copied from CRM [id number]. Topic: Quick Communication - See Telephone Encounter >> Dec 25, 2018  8:30 AM Louie Bun, Rosey Bath D wrote: CRM for notification. See Telephone encounter for: 12/25/18. Patient called and said that when he came in for his wellness visit they told him that he had to get a second doze of the pneumonia. He went to his pharmacy to get this but they told him that they didn't know what he was talking about. Patient would like a call back regarding this.

## 2018-12-25 NOTE — Telephone Encounter (Signed)
Per Reather Littler patient declined this vaccine during her visit.   Pneumococcal Vaccine: Due for Pneumococcal vaccine. Does the patient want to receive this vaccine today?  No . Education has been provided regarding the importance of this vaccine but still declined. Advised may receive this vaccine at local pharmacy or Health Dept. Aware to provide a copy of the vaccination record if obtained from local pharmacy or Health Dept. Verbalized acceptance and understanding.

## 2018-12-27 ENCOUNTER — Ambulatory Visit: Payer: Medicare Other | Admitting: Family Medicine

## 2019-03-07 ENCOUNTER — Ambulatory Visit: Payer: Self-pay

## 2019-03-07 NOTE — Telephone Encounter (Signed)
Attempted to contact pt to discuss symptoms; left message on voicemail for pt contact the office; the pt is seen by Dr Carlynn Purl at [facility];  will route to office for final disposition.

## 2019-03-07 NOTE — Telephone Encounter (Signed)
Incoming call from Pt reporting that he occasionally get headache on the left side of head.  . It comes and goes. Rates it mild. Denies any other Sx. Encouraged to call back if it occurs again.   Patient voices understanding.  Review protocol  And provided care advice.     Reason for Disposition . [1] Headache AND [2] has not taken pain medications  Answer Assessment - Initial Assessment Questions 1. LOCATION: "Where does it hurt?"      Left side not continues thing 2. ONSET: "When did the headache start?" (Minutes, hours or days)      3 to 76months 3. PATTERN: "Does the pain come and go, or has it been constant since it started?"     Comes and goes 4. SEVERITY: "How bad is the pain?" and "What does it keep you from doing?"  (e.g., Scale 1-10; mild, moderate, or severe)   - MILD (1-3): doesn't interfere with normal activities    - MODERATE (4-7): interferes with normal activities or awakens from sleep    - SEVERE (8-10): excruciating pain, unable to do any normal activities        mild 5. RECURRENT SYMPTOM: "Have you ever had headaches before?" If so, ask: "When was the last time?" and "What happened that time?"      yes 6. CAUSE: "What do you think is causing the headache?"     *No Answer* 7. MIGRAINE: "Have you been diagnosed with migraine headaches?" If so, ask: "Is this headache similar?"      denies 8. HEAD INJURY: "Has there been any recent injury to the head?"      denies 9. OTHER SYMPTOMS: "Do you have any other symptoms?" (fever, stiff neck, eye pain, sore throat, cold symptoms)     denies 10. PREGNANCY: "Is there any chance you are pregnant?" "When was your last menstrual period?"       na  Protocols used: HEADACHE-A-AH

## 2019-03-07 NOTE — Telephone Encounter (Signed)
Patient called, left VM to return call to the office to discuss headache symptoms.

## 2019-03-07 NOTE — Telephone Encounter (Signed)
No answer and VM not set up.

## 2019-03-08 NOTE — Telephone Encounter (Signed)
Called patient he has already been triaged by a nurse yesterday. He reports that everything worked out just fine.

## 2019-03-08 NOTE — Telephone Encounter (Signed)
Spoke to pt this morning. I offered him an appt and pt stated he doesn't feel like that is necessary. He thinks its only sinuses and will call back if he needs to.

## 2019-03-20 ENCOUNTER — Telehealth: Payer: Self-pay | Admitting: Family Medicine

## 2019-03-20 NOTE — Chronic Care Management (AMB) (Signed)
Chronic Care Management   Note  03/20/2019 Name: Brett Gill MRN: [id number] DOB: 12-15-1934  Brett Gill is a 84 y.o. year old male who is a primary care patient of Steele Sizer, MD. I reached out to Luna Kitchens by phone today in response to a referral sent by Brett Gill health plan.    Brett Gill was given information about Chronic Care Management services today including:  1. CCM service includes personalized support from designated clinical staff supervised by his physician, including individualized plan of care and coordination with other care providers 2. 24/7 contact phone numbers for assistance for urgent and routine care needs. 3. Service will only be billed when office clinical staff spend 20 minutes or more in a month to coordinate care. 4. Only one practitioner may furnish and bill the service in a calendar month. 5. The patient may stop CCM services at any time (effective at the end of the month) by phone call to the office staff. 6. The patient will be responsible for cost sharing (co-pay) of up to 20% of the service fee (after annual deductible is met).  Patient did not agree to enrollment in care management services and does not wish to consider at this time.  Follow up plan: The patient has been provided with contact information for the chronic care management team and has been advised to call with any health related questions or concerns.   [place]  [email or web address]   ??[phone]

## 2019-05-20 IMAGING — CR DG CHEST 2V
1 series · 2 of 2 positions shown · non-contrast
Comparison: None.

CLINICAL DATA: Shortness of breath

EXAM:
CHEST - 2 VIEW

[Series 1: dg chest 2 view · 0.14mm/px · 2 of 2 slices shown]
[im 1/2]
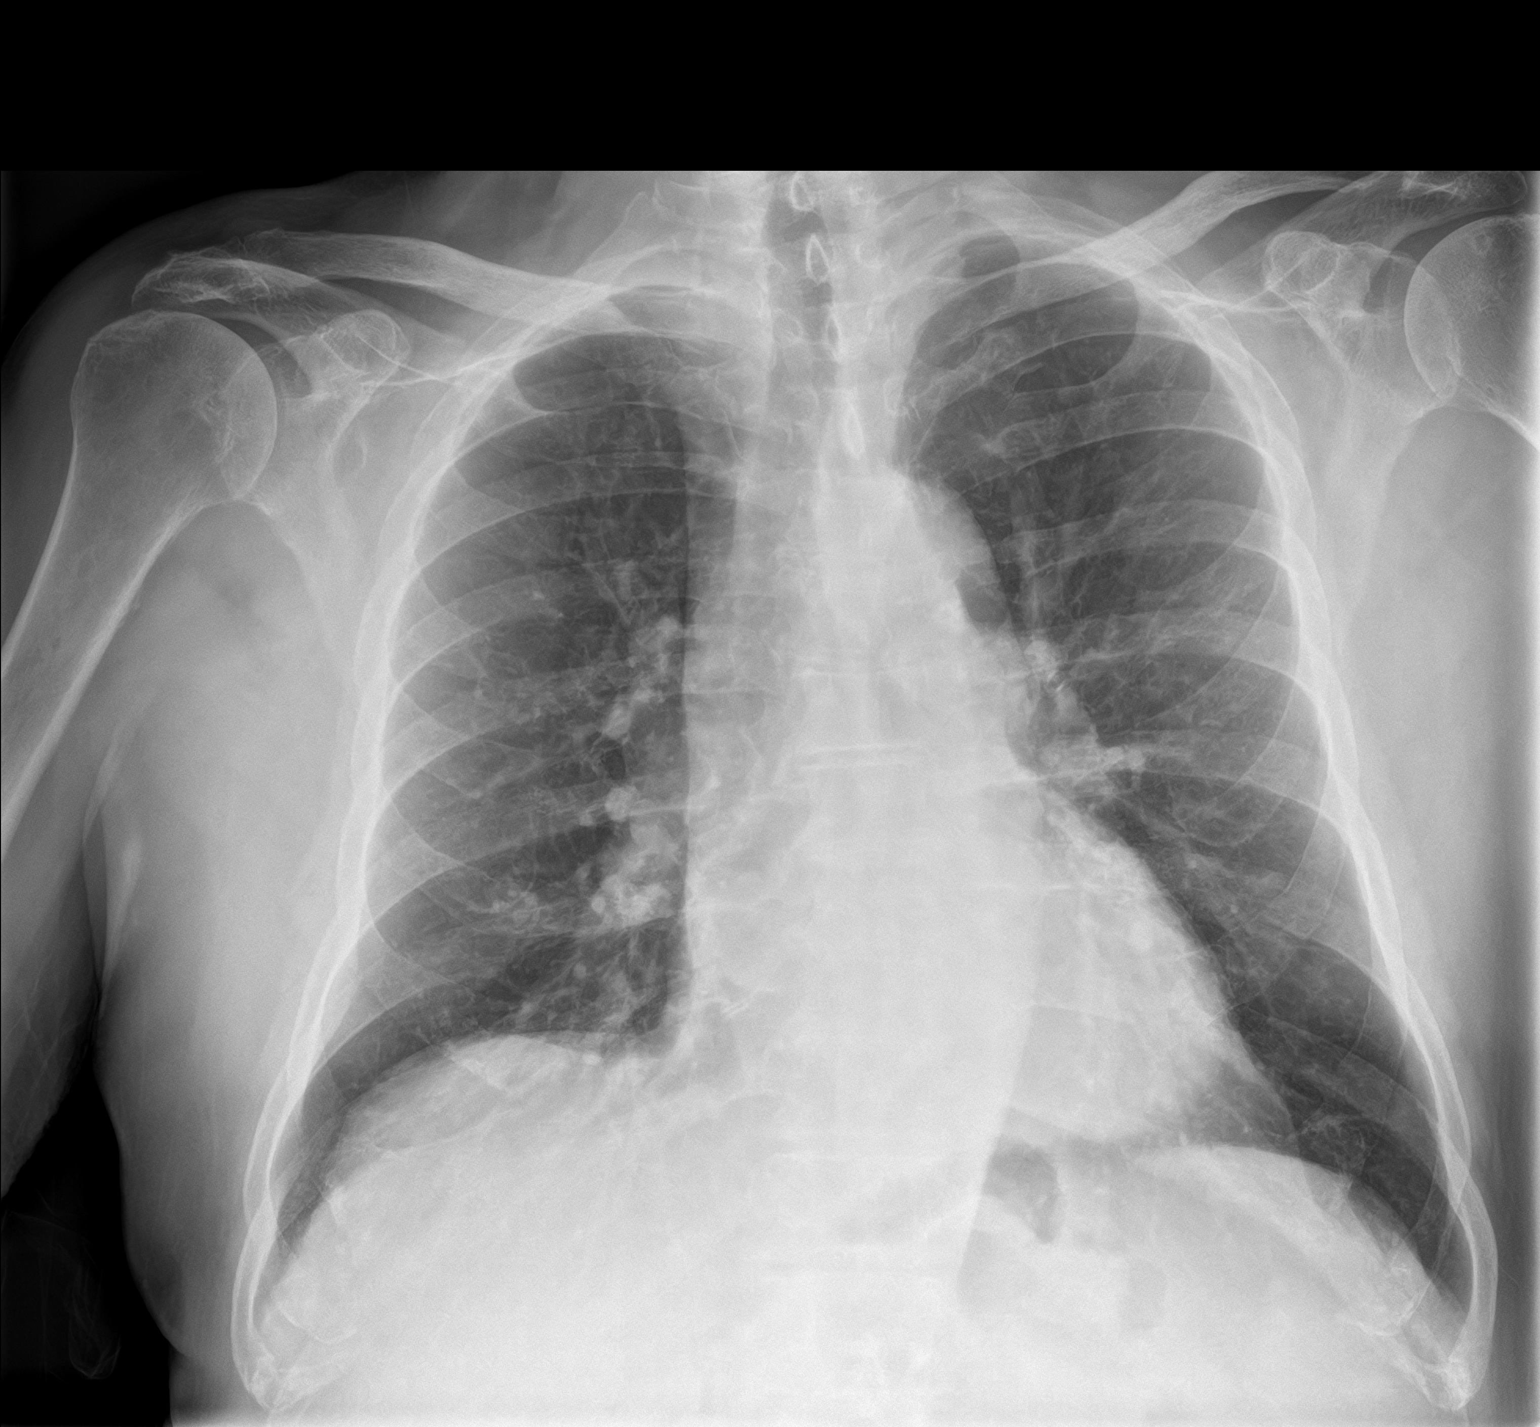
[im 2/2]
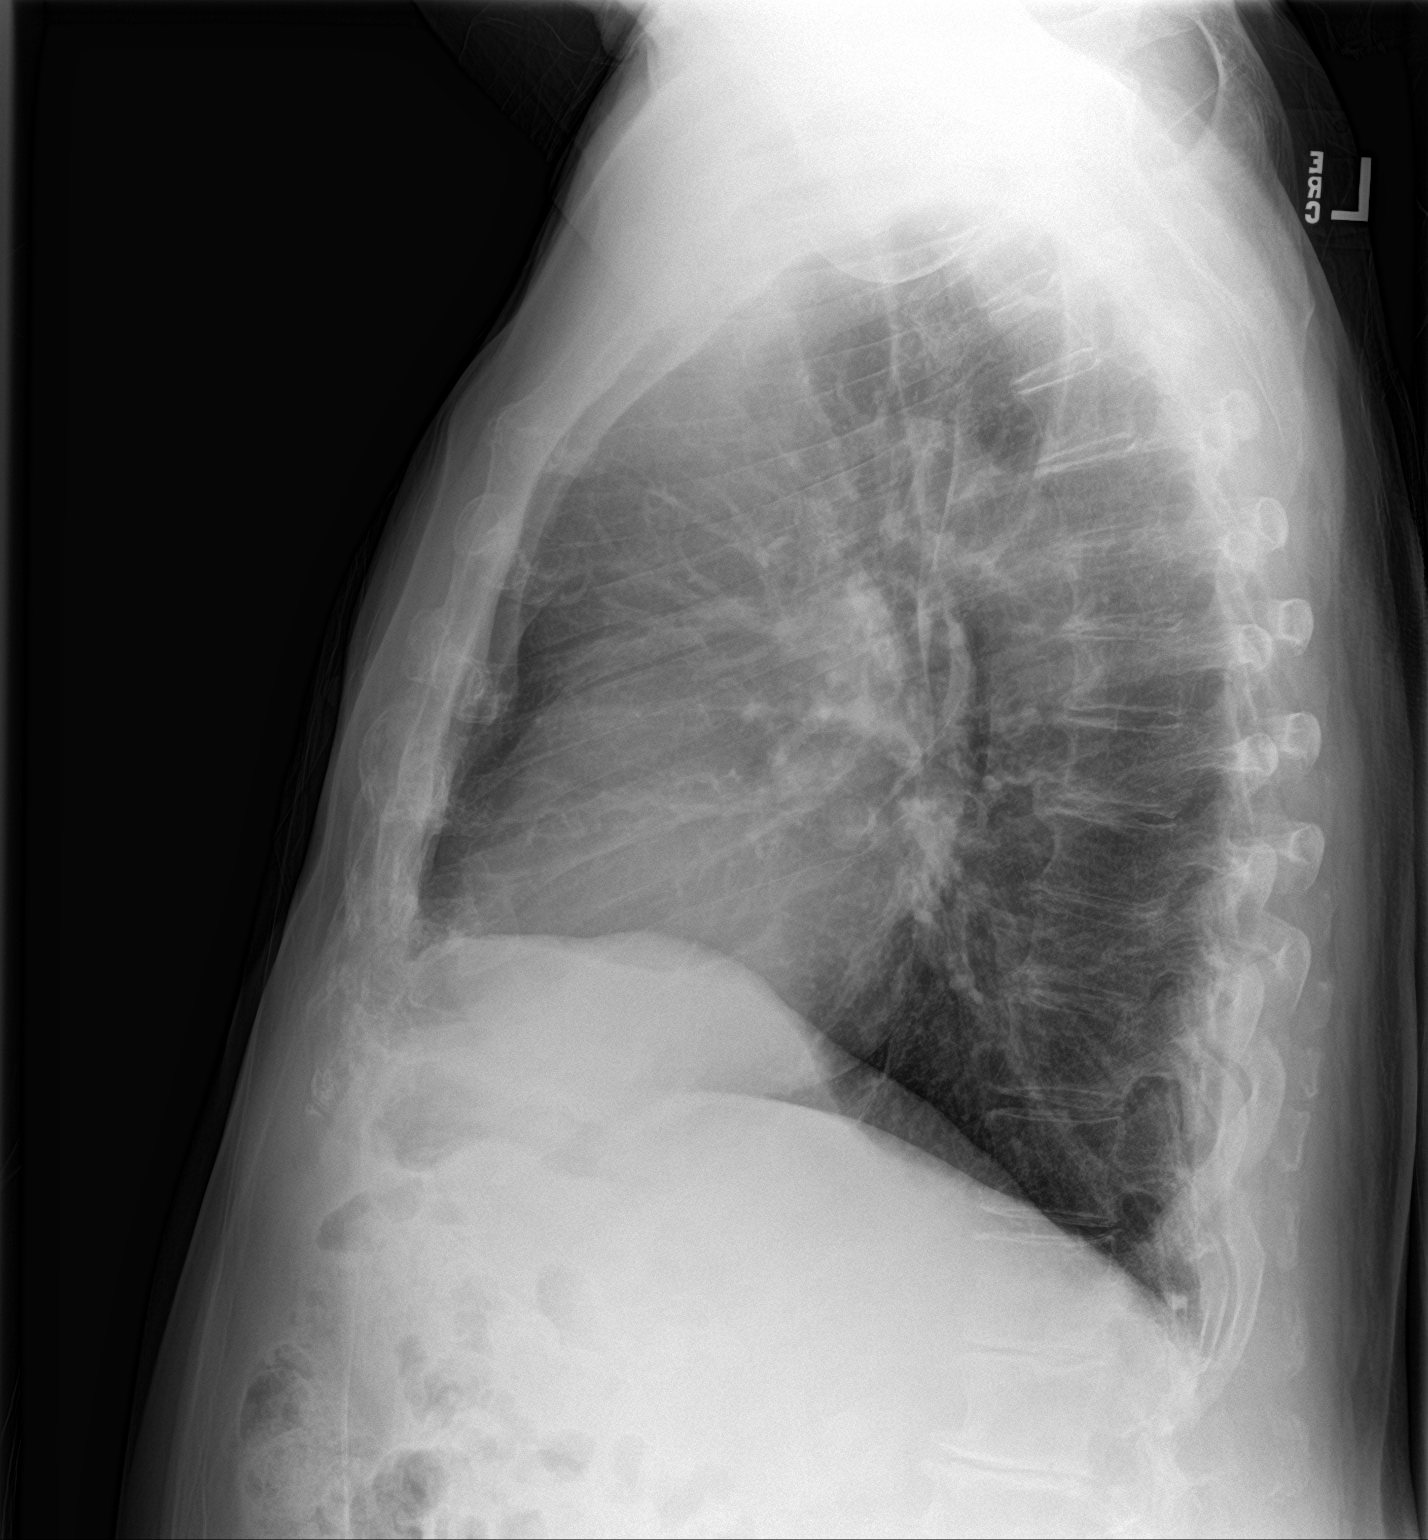

[2 of 2 positions shown; findings below may reference images not displayed]

FINDINGS: The heart size and mediastinal contours are within normal limits.
Both lungs are clear. The visualized skeletal structures are
unremarkable.
IMPRESSION: No active cardiopulmonary disease.

## 2019-06-28 ENCOUNTER — Telehealth: Payer: Self-pay | Admitting: Family Medicine

## 2019-06-28 NOTE — Telephone Encounter (Signed)
Patient dropping handicap sticker, please note when received.

## 2019-07-01 NOTE — Telephone Encounter (Signed)
Patient is checking status of handicap paperwork and would like to know when he can come pick the paperwork up. Patient call back [phone]

## 2019-07-01 NOTE — Telephone Encounter (Signed)
He is on sch

## 2019-07-04 ENCOUNTER — Other Ambulatory Visit: Payer: Self-pay

## 2019-07-04 ENCOUNTER — Encounter: Payer: Self-pay | Admitting: Family Medicine

## 2019-07-04 ENCOUNTER — Ambulatory Visit (INDEPENDENT_AMBULATORY_CARE_PROVIDER_SITE_OTHER): Payer: Medicare Other | Admitting: Family Medicine

## 2019-07-04 VITALS — BP 132/76 | HR 44 | Temp 96.8°F | Resp 14 | Ht 66.0 in | Wt 162.0 lb

## 2019-07-04 DIAGNOSIS — G8929 Other chronic pain: Secondary | ICD-10-CM

## 2019-07-04 DIAGNOSIS — D696 Thrombocytopenia, unspecified: Secondary | ICD-10-CM | POA: Diagnosis not present

## 2019-07-04 DIAGNOSIS — R001 Bradycardia, unspecified: Secondary | ICD-10-CM

## 2019-07-04 DIAGNOSIS — N183 Chronic kidney disease, stage 3 unspecified: Secondary | ICD-10-CM

## 2019-07-04 DIAGNOSIS — Z23 Encounter for immunization: Secondary | ICD-10-CM | POA: Diagnosis not present

## 2019-07-04 DIAGNOSIS — E538 Deficiency of other specified B group vitamins: Secondary | ICD-10-CM

## 2019-07-04 DIAGNOSIS — J410 Simple chronic bronchitis: Secondary | ICD-10-CM

## 2019-07-04 DIAGNOSIS — E7800 Pure hypercholesterolemia, unspecified: Secondary | ICD-10-CM | POA: Diagnosis not present

## 2019-07-04 DIAGNOSIS — M549 Dorsalgia, unspecified: Secondary | ICD-10-CM

## 2019-07-04 DIAGNOSIS — M4696 Unspecified inflammatory spondylopathy, lumbar region: Secondary | ICD-10-CM

## 2019-07-04 DIAGNOSIS — I10 Essential (primary) hypertension: Secondary | ICD-10-CM | POA: Diagnosis not present

## 2019-07-04 DIAGNOSIS — D692 Other nonthrombocytopenic purpura: Secondary | ICD-10-CM

## 2019-07-04 NOTE — Progress Notes (Signed)
Name: Brett Gill   MRN: [id number]    DOB: [birth year]   Date:07/04/2019       Progress Note  Subjective  Chief Complaint  Chief Complaint  Patient presents with  . handicap sticker    HPI  Chronic bronchitis: he used smoke about 1 pack daily for about 10 years, he quit about 30 years ago but still has some sob with activity, when he had an URI he has a productive cough and uses albuterol prn, he used Anoro for a flare in the past but not currently. Denies wheezing at this time  Thrombocytopenia: we will recheck labs today, he denies bleeding from urine , stools or gum . He bruises easily   CKI: advised him to stop NSAID"s and we will recheck levels. He denies decrease in urine output or pruritis. We will recheck labs   Spondylopathy: he goes to the [place] and gets pain medications for chronic back pain but no radiculitis. He is still able to mow his yard, and takes care of his house, but wants a handicap sticker only to use when he needs to walk far away. Pain is now dull and aching and 3-4/10   HTN: bp is at goal today, no chest pain or palpitation, she has a long history of bradycardia   Hyperlipidemia: on pravastatin, denies side effects, we will recheck labs today. He gets rx from the [place]   Patient Active Problem List   Diagnosis Date Noted  . Simple chronic bronchitis ([place]) 07/04/2019  . Unspecified inflammatory spondylopathy, lumbar region ([facility] [place]) 11/30/2018  . Thrombocytopenia ([place]) 11/30/2018  . Stage 3 chronic kidney disease ([place]) 11/30/2018  . Chronic shoulder pain (Secondary Area of pain) (Right) 04/25/2017  . Lumbar facet syndrome (Bilateral) (L>R) 04/25/2017  . Musculoskeletal pain 04/25/2017  . Neurogenic pain 04/25/2017  . Rotator cuff arthropathy (Right) 04/25/2017  . Osteoarthritis of shoulder (Right) 04/25/2017  . Osteoarthritis of AC (acromioclavicular) joint (Right) 04/25/2017  . Long term current use of opiate analgesic 03/23/2017  . Chronic pain syndrome  03/23/2017  . Long term prescription opiate use 03/23/2017  . Opiate use 03/23/2017  . Chronic low back pain (Primary Area of Pain) (Bilateral) (L>R) 03/23/2017  . Vitamin D deficiency 12/31/2015  . Hypertension 12/10/2015  . Hyperlipidemia 12/10/2015    Past Surgical History:  Procedure Laterality Date  . CATARACT EXTRACTION Bilateral 2010    Family History  Problem Relation Age of Onset  . Alzheimer's disease Mother   . Alzheimer's disease Father   . Heart disease Father   . Hypertension Sister   . Healthy Brother     Social History   Socioeconomic History  . Marital status: Divorced    Spouse name: Not on file  . Number of children: 1  . Years of education: Not on file  . Highest education level: 11th grade  Occupational History  . Occupation: Retired  Scientific laboratory technician  . Financial resource strain: Not hard at all  . Food insecurity    Worry: Never true    Inability: Never true  . Transportation needs    Medical: No    Non-medical: No  Tobacco Use  . Smoking status: Former Smoker    Packs/day: 0.50    Years: 20.00    Pack years: 10.00    Types: Cigarettes    Quit date: 1978    Years since quitting: 42.7  . Smokeless tobacco: Never Used  Substance and Sexual Activity  . Alcohol use: No  Alcohol/week: 0.0 standard drinks  . Drug use: No  . Sexual activity: Yes  Lifestyle  . Physical activity    Days per week: 3 days    Minutes per session: 60 min  . Stress: Not at all  Relationships  . Social connections    Talks on phone: More than three times a week    Gets together: More than three times a week    Attends religious service: 1 to 4 times per year    Active member of club or organization: No    Attends meetings of clubs or organizations: Never    Relationship status: Divorced  . Intimate partner violence    Fear of current or ex partner: No    Emotionally abused: No    Physically abused: No    Forced sexual activity: No  Other Topics Concern   . Not on file  Social History Narrative   Lives alone, has a dog, he has [facility] benefits     Current Outpatient Medications:  .  albuterol (PROVENTIL HFA;VENTOLIN HFA) 108 (90 Base) MCG/ACT inhaler, Inhale 2 puffs into the lungs every 6 (six) hours as needed for wheezing or shortness of breath., Disp: 1 Inhaler, Rfl: 2 .  brimonidine (ALPHAGAN) 0.2 % ophthalmic solution, Place 1 drop into both eyes 3 (three) times daily., Disp: , Rfl:  .  Cyanocobalamin (VITAMIN B 12 PO), Take 1 tablet by mouth daily. , Disp: , Rfl:  .  dorzolamide-timolol (COSOPT) 22.3-6.8 MG/ML ophthalmic solution, Place 1 drop into both eyes 2 (two) times daily. , Disp: , Rfl:  .  latanoprost (XALATAN) 0.005 % ophthalmic solution, Place 1 drop into both eyes at bedtime. , Disp: , Rfl:  .  lisinopril (PRINIVIL,ZESTRIL) 20 MG tablet, Take 1 tablet by mouth daily., Disp: , Rfl:  .  omeprazole (PRILOSEC) 20 MG capsule, Take 2 capsules by mouth daily., Disp: , Rfl:  .  oxyCODONE-acetaminophen (PERCOCET/ROXICET) 5-325 MG tablet, Take 1 tablet by mouth 2 (two) times daily as needed for severe pain. , Disp: , Rfl:  .  pravastatin (PRAVACHOL) 80 MG tablet, Take 1 tablet by mouth daily., Disp: , Rfl:  .  Calcium Carb-Cholecalciferol (CALCIUM PLUS D3 ABSORBABLE) [phone] MG-UNIT CAPS, Take 1 capsule by mouth daily with breakfast., Disp: 90 capsule, Rfl: 0  No Known Allergies  I personally reviewed active problem list, medication list, allergies, family history, social history, health maintenance with the patient/caregiver today.   ROS  Constitutional: Negative for fever or weight change.  Respiratory: Negative for cough but has intermittent  shortness of breath.   Cardiovascular: Negative for chest pain or palpitations.  Gastrointestinal: Negative for abdominal pain, no bowel changes.  Musculoskeletal: Negative for gait problem ( stops because of pain) but no joint swelling.  Skin: Negative for rash.  Neurological: Negative for  dizziness or headache.  No other specific complaints in a complete review of systems (except as listed in HPI above).   Objective  Vitals:   07/04/19 1016  BP: 132/76  Pulse: (!) 44  Resp: 14  Temp: (!) 96.8 F (36 C)  TempSrc: Temporal  SpO2: 98%  Weight: 162 lb (73.5 kg)  Height: 5\' 6"  (1.676 m)    Body mass index is 26.15 kg/m.  Physical Exam  Constitutional: Patient appears well-developed and well-nourished.  No distress.  HEENT: head atraumatic, normocephalic, pupils equal and reactive to light Cardiovascular: sinus bradycardia , regular rhythm and normal heart sounds.  No murmur heard. No BLE edema. Pulmonary/Chest:  Effort normal , he has expiratory wheezing, no rhonchi  No respiratory distress. Abdominal: Soft.  There is no tenderness. Psychiatric: Patient has a normal mood and affect. behavior is normal. Judgment and thought content normal. Skin: senile purpura arm  Muscular Skeletal: no tenderness during palpation of lumbar spine, negative straight leg raise    PHQ2/9: Depression screen [facility] 2/9 07/04/2019 12/11/2018 11/30/2018 12/08/2017 04/25/2017  Decreased Interest 0 0 0 0 0  Down, Depressed, Hopeless 0 0 0 0 0  PHQ - 2 Score 0 0 0 0 0  Altered sleeping 0 - - 0 -  Tired, decreased energy 0 - - 0 -  Change in appetite 0 - - 0 -  Feeling bad or failure about yourself  0 - - 0 -  Trouble concentrating 0 - - 0 -  Moving slowly or fidgety/restless 0 - - 0 -  Suicidal thoughts 0 - - 0 -  PHQ-9 Score 0 - - 0 -  Difficult doing work/chores Not difficult at all - - Not difficult at all -    phq 9 is negative   Fall Risk: Fall Risk  07/04/2019 12/11/2018 11/30/2018 08/31/2018 12/08/2017  Falls in the past year? 0 0 0 0 No  Number falls in past yr: 0 0 0 - -  Injury with Fall? 0 0 0 - -  Follow up - Falls prevention discussed - - -      Functional Status Survey: Is the patient deaf or have difficulty hearing?: No Does the patient have difficulty seeing, even when  wearing glasses/contacts?: No Does the patient have difficulty concentrating, remembering, or making decisions?: No Does the patient have difficulty walking or climbing stairs?: No Does the patient have difficulty dressing or bathing?: No Does the patient have difficulty doing errands alone such as visiting a doctor's office or shopping?: No   Assessment & Plan  1. Thrombocytopenia (HCC)  - CBC with Differential/Platelet  2. Stage 3 chronic kidney disease (HCC)  - CBC with Differential/Platelet - COMPLETE METABOLIC PANEL WITH GFR - VITAMIN D 25 Hydroxy (Vit-D Deficiency, Fractures)  3. Essential hypertension  - CBC with Differential/Platelet - COMPLETE METABOLIC PANEL WITH GFR  4. Need for immunization against influenza  - Flu Vaccine QUAD High Dose(Fluad)  5. Need for vaccination against Streptococcus pneumoniae  - Pneumococcal polysaccharide vaccine 23-valent greater than or equal to 2yo subcutaneous/IM  6. Pure hypercholesterolemia  - Lipid panel  7. Simple chronic bronchitis (HCC)  Discussed going on daily Anoro but he prefers prn medication   8. Sinus bradycardia  He states always slow, seen by cardiologist in the past, last EKG showed sinus rhythm rate 51   9. Unspecified inflammatory spondylopathy, lumbar region (HCC)  Getting pan medications from the [place]  10. Chronic back pain greater than 3 months duration   11. B12 deficiency  - B12  12. Senile purpura (HCC)  Right arm and wrist

## 2019-07-08 ENCOUNTER — Telehealth: Payer: Self-pay

## 2019-07-08 ENCOUNTER — Other Ambulatory Visit: Payer: Self-pay | Admitting: Family Medicine

## 2019-07-08 DIAGNOSIS — N183 Chronic kidney disease, stage 3 unspecified: Secondary | ICD-10-CM

## 2019-07-08 LAB — CBC WITH DIFFERENTIAL/PLATELET
Absolute Monocytes: 515 cells/uL (ref 200–950)
Basophils Absolute: 21 cells/uL (ref 0–200)
Basophils Relative: 0.4 %
Eosinophils Absolute: 172 cells/uL (ref 15–500)
Eosinophils Relative: 3.3 %
HCT: 37.4 % — ABNORMAL LOW (ref 38.5–50.0)
Hemoglobin: 12.6 g/dL — ABNORMAL LOW (ref 13.2–17.1)
Lymphs Abs: 1243 cells/uL (ref 850–3900)
MCH: 32.1 pg (ref 27.0–33.0)
MCHC: 33.7 g/dL (ref 32.0–36.0)
MCV: 95.2 fL (ref 80.0–100.0)
MPV: 10.9 fL (ref 7.5–12.5)
Monocytes Relative: 9.9 %
Neutro Abs: 3250 cells/uL (ref 1500–7800)
Neutrophils Relative %: 62.5 %
Platelets: 153 10*3/uL (ref 140–400)
RBC: 3.93 10*6/uL — ABNORMAL LOW (ref 4.20–5.80)
RDW: 12.5 % (ref 11.0–15.0)
Total Lymphocyte: 23.9 %
WBC: 5.2 10*3/uL (ref 3.8–10.8)

## 2019-07-08 LAB — COMPLETE METABOLIC PANEL WITH GFR
AG Ratio: 1.8 (calc) (ref 1.0–2.5)
ALT: 13 U/L (ref 9–46)
AST: 17 U/L (ref 10–35)
Albumin: 3.9 g/dL (ref 3.6–5.1)
Alkaline phosphatase (APISO): 63 U/L (ref 35–144)
BUN/Creatinine Ratio: 11 (calc) (ref 6–22)
BUN: 21 mg/dL (ref 7–25)
CO2: 26 mmol/L (ref 20–32)
Calcium: 9 mg/dL (ref 8.6–10.3)
Chloride: 106 mmol/L (ref 98–110)
Creat: 1.86 mg/dL — ABNORMAL HIGH (ref 0.70–1.11)
GFR, Est African American: 38 mL/min/{1.73_m2} — ABNORMAL LOW (ref >=60)
GFR, Est Non African American: 33 mL/min/{1.73_m2} — ABNORMAL LOW (ref >=60)
Globulin: 2.2 g/dL (calc) (ref 1.9–3.7)
Glucose, Bld: 91 mg/dL (ref 65–99)
Potassium: 4.8 mmol/L (ref 3.5–5.3)
Sodium: 139 mmol/L (ref 135–146)
Total Bilirubin: 0.6 mg/dL (ref 0.2–1.2)
Total Protein: 6.1 g/dL (ref 6.1–8.1)

## 2019-07-08 LAB — LIPID PANEL
Cholesterol: 126 mg/dL (ref <200)
HDL: 33 mg/dL — ABNORMAL LOW (ref >=40)
LDL Cholesterol (Calc): 67 mg/dL (calc)
Non-HDL Cholesterol (Calc): 93 mg/dL (calc) (ref <130)
Total CHOL/HDL Ratio: 3.8 (calc) (ref <5.0)
Triglycerides: 182 mg/dL — ABNORMAL HIGH (ref <150)

## 2019-07-08 LAB — IRON,TIBC AND FERRITIN PANEL
%SAT: 25 % (calc) (ref 20–48)
Ferritin: 119 ng/mL (ref 24–380)
Iron: 72 ug/dL (ref 50–180)
TIBC: 286 mcg/dL (calc) (ref 250–425)

## 2019-07-08 LAB — VITAMIN D 25 HYDROXY (VIT D DEFICIENCY, FRACTURES): Vit D, 25-Hydroxy: 26 ng/mL — ABNORMAL LOW (ref 30–100)

## 2019-07-08 LAB — TEST AUTHORIZATION

## 2019-07-08 LAB — VITAMIN B12: Vitamin B-12: 801 pg/mL (ref 200–1100)

## 2019-07-08 NOTE — Telephone Encounter (Signed)
Copied from [place] [id number]. Topic: General - Other >> Jul 08, 2019 11:11 AM Keene Breath wrote: Reason for CRM: Patient called to inform the nurse that he prefers to set up an appt. With a kidney doctor through his [place], since he already goes there and it is no cost.  Please advise and call patient back if you need to discuss further.  CB# [phone]

## 2019-07-09 ENCOUNTER — Telehealth: Payer: Self-pay

## 2019-07-09 NOTE — Telephone Encounter (Signed)
Patient does not see a nephrologist and would like a referral done to see one at [facility]. If he has to see one.

## 2019-07-09 NOTE — Telephone Encounter (Signed)
Patient called.  Patient aware.  

## 2019-07-09 NOTE — Telephone Encounter (Signed)
Patient calling and states that he spoke with Crystal and was calling back to give her a fax number for Dr Candee Furbish at the [place]. States that Dr Candee Furbish is needing his lab report from 07/04/2019 and documentation of his flu shot. Fax#: [phone]

## 2019-07-10 NOTE — Telephone Encounter (Signed)
Done. Labs and flu documentation have been faxed to Dr. Candee Furbish at the [facility].

## 2019-07-11 NOTE — Telephone Encounter (Signed)
Patient is calling because the [facility] said that they have not received the faxed paperwork.  Please follow up and let he patient know when it has been done.  CB# [phone]

## 2019-07-12 NOTE — Telephone Encounter (Signed)
I faxed the paperwork over 3 days ago and received OK for confirmation. I will refax it today.

## 2019-08-26 DIAGNOSIS — M545 Low back pain: Secondary | ICD-10-CM | POA: Diagnosis not present

## 2019-09-25 ENCOUNTER — Other Ambulatory Visit: Payer: Self-pay

## 2019-09-25 ENCOUNTER — Ambulatory Visit: Payer: Medicare Other | Attending: Internal Medicine

## 2019-09-25 DIAGNOSIS — Z20822 Contact with and (suspected) exposure to covid-19: Secondary | ICD-10-CM

## 2019-09-27 LAB — NOVEL CORONAVIRUS, NAA: SARS-CoV-2, NAA: NOT DETECTED

## 2019-12-12 ENCOUNTER — Ambulatory Visit: Payer: Medicare Other

## 2020-01-01 ENCOUNTER — Ambulatory Visit: Payer: Medicare Other | Admitting: Family Medicine

## 2020-01-30 ENCOUNTER — Telehealth: Payer: Self-pay | Admitting: Family Medicine

## 2020-01-30 NOTE — Telephone Encounter (Signed)
Spoke with patient for wellness visit, he stated he did not want to AWV, he sees the [facility] for his wellness and do not want to participate.

## 2021-02-23 DIAGNOSIS — H401131 Primary open-angle glaucoma, bilateral, mild stage: Secondary | ICD-10-CM | POA: Diagnosis not present

## 2021-04-07 DIAGNOSIS — H401131 Primary open-angle glaucoma, bilateral, mild stage: Secondary | ICD-10-CM | POA: Diagnosis not present

## 2021-08-09 DIAGNOSIS — H401131 Primary open-angle glaucoma, bilateral, mild stage: Secondary | ICD-10-CM | POA: Diagnosis not present

## 2021-10-14 ENCOUNTER — Ambulatory Visit: Payer: Medicare Other

## 2021-10-27 DIAGNOSIS — I10 Essential (primary) hypertension: Secondary | ICD-10-CM | POA: Insufficient documentation

## 2021-10-27 DIAGNOSIS — K219 Gastro-esophageal reflux disease without esophagitis: Secondary | ICD-10-CM | POA: Insufficient documentation

## 2021-11-16 ENCOUNTER — Ambulatory Visit: Payer: Medicare Other | Admitting: Family Medicine

## 2022-01-31 ENCOUNTER — Ambulatory Visit: Payer: Self-pay | Admitting: *Deleted

## 2022-01-31 DIAGNOSIS — E785 Hyperlipidemia, unspecified: Secondary | ICD-10-CM | POA: Insufficient documentation

## 2022-01-31 DIAGNOSIS — Z719 Counseling, unspecified: Secondary | ICD-10-CM | POA: Insufficient documentation

## 2022-01-31 DIAGNOSIS — K219 Gastro-esophageal reflux disease without esophagitis: Secondary | ICD-10-CM | POA: Insufficient documentation

## 2022-01-31 DIAGNOSIS — M545 Low back pain, unspecified: Secondary | ICD-10-CM | POA: Insufficient documentation

## 2022-01-31 DIAGNOSIS — R69 Illness, unspecified: Secondary | ICD-10-CM | POA: Insufficient documentation

## 2022-01-31 DIAGNOSIS — M19011 Primary osteoarthritis, right shoulder: Secondary | ICD-10-CM | POA: Insufficient documentation

## 2022-01-31 DIAGNOSIS — I10 Essential (primary) hypertension: Secondary | ICD-10-CM | POA: Insufficient documentation

## 2022-01-31 DIAGNOSIS — I119 Hypertensive heart disease without heart failure: Secondary | ICD-10-CM | POA: Insufficient documentation

## 2022-01-31 DIAGNOSIS — M542 Cervicalgia: Secondary | ICD-10-CM | POA: Insufficient documentation

## 2022-01-31 DIAGNOSIS — M25819 Other specified joint disorders, unspecified shoulder: Secondary | ICD-10-CM | POA: Insufficient documentation

## 2022-01-31 DIAGNOSIS — I259 Chronic ischemic heart disease, unspecified: Secondary | ICD-10-CM | POA: Insufficient documentation

## 2022-01-31 DIAGNOSIS — M549 Dorsalgia, unspecified: Secondary | ICD-10-CM | POA: Insufficient documentation

## 2022-01-31 DIAGNOSIS — Z760 Encounter for issue of repeat prescription: Secondary | ICD-10-CM | POA: Insufficient documentation

## 2022-01-31 DIAGNOSIS — M19019 Primary osteoarthritis, unspecified shoulder: Secondary | ICD-10-CM | POA: Insufficient documentation

## 2022-01-31 DIAGNOSIS — H34811 Central retinal vein occlusion, right eye, with macular edema: Secondary | ICD-10-CM | POA: Insufficient documentation

## 2022-01-31 DIAGNOSIS — H409 Unspecified glaucoma: Secondary | ICD-10-CM | POA: Insufficient documentation

## 2022-01-31 DIAGNOSIS — Z961 Presence of intraocular lens: Secondary | ICD-10-CM | POA: Insufficient documentation

## 2022-01-31 DIAGNOSIS — M8580 Other specified disorders of bone density and structure, unspecified site: Secondary | ICD-10-CM | POA: Insufficient documentation

## 2022-01-31 DIAGNOSIS — H348112 Central retinal vein occlusion, right eye, stable: Secondary | ICD-10-CM | POA: Insufficient documentation

## 2022-01-31 NOTE — Telephone Encounter (Signed)
Reason for Disposition ? [1] MODERATE pain (e.g., interferes with normal activities, limping) AND [2] present > 3 days ?   Left leg pain since Jan. 2023 ? ?Answer Assessment - Initial Assessment Questions ?1. ONSET: "When did the pain start?"  ?    Left leg pain.   I've had problems with it since first of the year.   I went to [facility] and was referred to sports medicine.  X ray done no bone problems noted. ?2. LOCATION: "Where is the pain located?"  ?    Left leg ?3. PAIN: "How bad is the pain?"    (Scale 1-10; or mild, moderate, severe) ?  -  MILD (1-3): doesn't interfere with normal activities  ?  -  MODERATE (4-7): interferes with normal activities (e.g., work or school) or awakens from sleep, limping  ?  -  SEVERE (8-10): excruciating pain, unable to do any normal activities, unable to walk ?    I got a cortisone shot in my leg but it has not helped.   ?4. WORK OR EXERCISE: "Has there been any recent work or exercise that involved this part of the body?"  ?    *No Answer* ?5. CAUSE: "What do you think is causing the leg pain?" ?    *No Answer* ?6. OTHER SYMPTOMS: "Do you have any other symptoms?" (e.g., chest pain, back pain, breathing difficulty, swelling, rash, fever, numbness, weakness) ?    *No Answer* ?7. PREGNANCY: "Is there any chance you are pregnant?" "When was your last menstrual period?" ?    *No Answer* ? ?Protocols used: Leg Pain-A-AH ? ?

## 2022-01-31 NOTE — Telephone Encounter (Signed)
?  Chief Complaint: Left leg pain since Jan. ?Symptoms: Pain in left leg even after cortisone shot ?Frequency: constantly   Hard to get around ?Pertinent Negatives: Patient denies N/A ?Disposition: [] ED /[] Urgent Care (no appt availability in office) / [x] Appointment(In office/virtual)/ []  [facility]/ [] Home Care/ [] Refused Recommended Disposition /[]  Mobile Bus/ []  Follow-up with PCP ?Additional Notes: Appt was made by agent for tomorrow morning with Jacquelin Hawking, PA.  ?

## 2022-01-31 NOTE — Progress Notes (Signed)
? ? ?  ?    Acute Office Visit ? ? ?Patient: Brett Gill   DOB: 1935-08-19   87 y.o. Male  MRN: [id number] ?Visit Date: 02/01/2022 ? ?Today's healthcare provider: Dani Gobble Nazir Hacker, PA-C  ?Introduced myself to the patient as a Journalist, newspaper and provided education on APPs in clinical practice.  ? ? ?Chief Complaint  ?Patient presents with  ? Leg Pain  ?  left leg  ? ?Subjective  ?  ?HPI ?HPI   ? ? Leg Pain   ? Additional comments: left leg ? ?  ?  ?Last edited by Royal Hawthorn, CMA on 02/01/2022  8:40 AM.  ?  ?  ? ?States he received a steroid injection from Ortho in Feb but did not notice a difference when he received this  ?States pain is radiating from knee up into the thigh and down into the calf  ?He is currently taking Oxycodone for his back pain- he is taking Tylenol twice per day as well to assist with pain ? ?Aggravating: standing on it, feels better when he is laying down at night ? ?Alleviating: sitting with leg outstretched or when sitting in recliner  ? ?Reports low left sided  back pain as well - unsure if this is connected to left leg pain  ?He states that both of his legs stay cold - denies numbness in extremities ?Has been using [employer], aspercreme for pain as well.  ? ? ?States he is using Voltaren gel and this does help provide relief.  ? ?Medications: ?Outpatient Medications Prior to Visit  ?Medication Sig  ? acetaminophen (TYLENOL) 650 MG CR tablet Take by mouth.  ? albuterol (PROVENTIL HFA;VENTOLIN HFA) 108 (90 Base) MCG/ACT inhaler Inhale 2 puffs into the lungs every 6 (six) hours as needed for wheezing or shortness of breath.  ? alendronate (FOSAMAX) 70 MG tablet Take 1 tablet by mouth once a week.  ? brimonidine (ALPHAGAN) 0.2 % ophthalmic solution Place 1 drop into both eyes 3 (three) times daily.  ? Cholecalciferol 25 MCG (1000 UT) tablet Take 1 tablet by mouth daily.  ? cyanocobalamin 1000 MCG tablet Take by mouth.  ? dorzolamide-timolol (COSOPT) 22.3-6.8 MG/ML ophthalmic solution Place 1 drop  into both eyes 2 (two) times daily.   ? lisinopril (PRINIVIL,ZESTRIL) 20 MG tablet Take 1 tablet by mouth daily.  ? lisinopril (ZESTRIL) 20 MG tablet Take by mouth.  ? lisinopril (ZESTRIL) 40 MG tablet Take 0.5 tablets by mouth daily.  ? omeprazole (PRILOSEC) 20 MG capsule Take 2 capsules by mouth daily.  ? omeprazole (PRILOSEC) 20 MG capsule Take 1 capsule by mouth at bedtime.  ? oxyCODONE-acetaminophen (PERCOCET/ROXICET) 5-325 MG tablet Take 1 tablet by mouth 2 (two) times daily as needed for severe pain.   ? pravastatin (PRAVACHOL) 80 MG tablet Take 1 tablet by mouth daily.  ? predniSONE (DELTASONE) 10 MG tablet Take 10 mg by mouth 2 (two) times daily.  ? [DISCONTINUED] brimonidine (ALPHAGAN) 0.2 % ophthalmic solution Apply to eye.  ? [DISCONTINUED] Cyanocobalamin (VITAMIN B 12 PO) Take 1 tablet by mouth daily.   ? [DISCONTINUED] pravastatin (PRAVACHOL) 40 MG tablet Take 2 tablets by mouth at bedtime.  ? Calcium Carb-Cholecalciferol (CALCIUM PLUS D3 ABSORBABLE) [phone] MG-UNIT CAPS Take 1 capsule by mouth daily with breakfast.  ? [DISCONTINUED] latanoprost (XALATAN) 0.005 % ophthalmic solution Place 1 drop into both eyes at bedtime.  (Patient not taking: Reported on 02/01/2022)  ? ?No facility-administered medications prior to visit.  ? ? ?Review  of Systems  ?Musculoskeletal:  Positive for arthralgias, back pain, gait problem and myalgias.  ?Neurological:  Negative for tremors and numbness.  ? ? ?  Objective  ?  ?BP 128/72 (BP Location: Right Arm, Patient Position: Sitting, Cuff Size: Normal)   Pulse 62   Temp 97.9 ?F (36.6 ?C) (Oral)   Resp 16   Ht 5\' 8"  (1.727 m) Comment: per chart  Wt 159 lb (72.1 kg)   SpO2 95%   BMI 24.18 kg/m?  ? ? ?Physical Exam ?Vitals reviewed.  ?Constitutional:   ?   General: He is awake.  ?   Appearance: Normal appearance. He is well-developed, well-groomed and normal weight.  ?HENT:  ?   Head: Normocephalic and atraumatic.  ?Cardiovascular:  ?   Pulses:     ?     Dorsalis pedis  pulses are 2+ on the right side and 0 on the left side.  ?     Posterior tibial pulses are 2+ on the right side and 0 on the left side.  ?Musculoskeletal:  ?   Thoracic back: No swelling, edema or deformity. No scoliosis.  ?   Lumbar back: Tenderness present. No swelling, edema or deformity.  ?     Back: ? ?   Right knee: Crepitus present.  ?   Instability Tests: Anterior drawer test negative. Posterior drawer test negative. Medial McMurray test negative and lateral McMurray test negative.  ?   Left knee: Crepitus present. No swelling, deformity, effusion, erythema, ecchymosis, lacerations or bony tenderness. Normal range of motion. No tenderness. Abnormal meniscus.  ?   Instability Tests: Anterior drawer test negative. Posterior drawer test negative. Medial McMurray test negative and lateral McMurray test negative.  ?   Right lower leg: No edema.  ?   Left lower leg: No edema.  ?   Comments: Mild discomfort along lateral joint aspect of left knee with Apley grind maneuver ?  ?Neurological:  ?   General: No focal deficit present.  ?   Mental Status: He is alert.  ?   GCS: GCS eye subscore is 4. GCS verbal subscore is 5. GCS motor subscore is 6.  ?Psychiatric:     ?   Attention and Perception: Attention and perception normal.     ?   Mood and Affect: Mood and affect normal.     ?   Speech: Speech normal.     ?   Behavior: Behavior normal. Behavior is cooperative.  ?  ? ? ?No results found for any visits on 02/01/22. ? Assessment & Plan  ?  ? ? ?Problem List Items Addressed This Visit   ? ?  ? Cardiovascular and Mediastinum  ? Essential hypertension  ?  Chronic, historic condition ?Appears well managed with lisinopril 20 mg po QD ?Continue current medications ?Follow up in 6 months for monitoring ? ? ?  ?  ? Relevant Medications  ? lisinopril (ZESTRIL) 20 MG tablet  ? lisinopril (ZESTRIL) 40 MG tablet  ? Other Relevant Orders  ? CBC w/Diff/Platelet  ?  ? Musculoskeletal and Integument  ? Primary osteoarthritis of left  knee - Primary  ?  Chronic, progressing ?Patient reports pain that is 7/10 - relieved with rest, aggravated by walking ?Reports potential compensatory pain in left thigh and calf ?Patient has seen Orthopedics for this is Feb - received steroid injection but this has not provided lasting relief, not eligible for another injection until May ?At this time, since I am unsure of  his kidney function, I recommend using Voltaren gel as needed, Tylenol, warm compress to the area, and knee brace to assist with symptoms.  ?[facility].  ?Discussed staying active as allowed and surgical intervention - patient is not amenable to surgery and age may be exclusionary factor at this point ?Follow up as needed for persisting or worsening symptoms.  ? ?  ?  ? Relevant Medications  ? acetaminophen (TYLENOL) 650 MG CR tablet  ? predniSONE (DELTASONE) 10 MG tablet  ?  ? Genitourinary  ? Stage 3 chronic kidney disease ([place])  ?  Chronic, historic condition ?Patient appears to be avoiding nephrotoxic agents ?Recommend he continue to do this, stay well hydrated ?Follow up in 6 months for monitoring ? ? ?  ?  ? Relevant Orders  ? Comprehensive Metabolic Panel (CMET)  ?  ? Other  ? Vitamin D deficiency  ? Relevant Orders  ? Vitamin D (25 hydroxy)  ? ? ? ?No follow-ups on file. ? ? ?I, Torrey Ballinas E Woodfin Kiss, PA-C, have reviewed all documentation for this visit. The documentation on 02/01/22 for the exam, diagnosis, procedures, and orders are all accurate and complete. ? ? ?Laasya Peyton, MHS, PA-C ?[facility] ?[facility]  ? ? ?No follow-ups on file.  ?   ? ? ? ? ?

## 2022-01-31 NOTE — Progress Notes (Deleted)
Name: Brett Gill   MRN: [id number]    DOB: 1935-09-18   Date:01/31/2022 ? ?     Progress Note ? ?Subjective ? ?Chief Complaint ? ?Acute visit for left leg pain  ? ?HPI ? ?*** ?Patient Active Problem List  ? Diagnosis Date Noted  ? Simple chronic bronchitis ([place]) 07/04/2019  ? Unspecified inflammatory spondylopathy, lumbar region [facility]) 11/30/2018  ? Thrombocytopenia ([place]) 11/30/2018  ? Stage 3 chronic kidney disease ([place]) 11/30/2018  ? Chronic shoulder pain (Secondary Area of pain) (Right) 04/25/2017  ? Lumbar facet syndrome (Bilateral) (L>R) 04/25/2017  ? Musculoskeletal pain 04/25/2017  ? Neurogenic pain 04/25/2017  ? Rotator cuff arthropathy (Right) 04/25/2017  ? Osteoarthritis of shoulder (Right) 04/25/2017  ? Osteoarthritis of AC (acromioclavicular) joint (Right) 04/25/2017  ? Long term current use of opiate analgesic 03/23/2017  ? Chronic pain syndrome 03/23/2017  ? Long term prescription opiate use 03/23/2017  ? Opiate use 03/23/2017  ? Chronic low back pain (Primary Area of Pain) (Bilateral) (L>R) 03/23/2017  ? Vitamin D deficiency 12/31/2015  ? Hypertension 12/10/2015  ? Hyperlipidemia 12/10/2015  ? ? ?Past Surgical History:  ?Procedure Laterality Date  ? CATARACT EXTRACTION Bilateral 2010  ? ? ?Family History  ?Problem Relation Age of Onset  ? Alzheimer's disease Mother   ? Alzheimer's disease Father   ? Heart disease Father   ? Hypertension Sister   ? Healthy Brother   ? ? ?Social History  ? ?Tobacco Use  ? Smoking status: Former  ?  Packs/day: 0.50  ?  Years: 20.00  ?  Pack years: 10.00  ?  Types: Cigarettes  ?  Quit date: 22  ?  Years since quitting: 45.3  ? Smokeless tobacco: Never  ?Substance Use Topics  ? Alcohol use: No  ?  Alcohol/week: 0.0 standard drinks  ? ? ? ?Current Outpatient Medications:  ?  albuterol (PROVENTIL HFA;VENTOLIN HFA) 108 (90 Base) MCG/ACT inhaler, Inhale 2 puffs into the lungs every 6 (six) hours as needed for wheezing or shortness of breath., Disp: 1 Inhaler, Rfl: 2 ?   brimonidine (ALPHAGAN) 0.2 % ophthalmic solution, Place 1 drop into both eyes 3 (three) times daily., Disp: , Rfl:  ?  Calcium Carb-Cholecalciferol (CALCIUM PLUS D3 ABSORBABLE) [phone] MG-UNIT CAPS, Take 1 capsule by mouth daily with breakfast., Disp: 90 capsule, Rfl: 0 ?  Cyanocobalamin (VITAMIN B 12 PO), Take 1 tablet by mouth daily. , Disp: , Rfl:  ?  dorzolamide-timolol (COSOPT) 22.3-6.8 MG/ML ophthalmic solution, Place 1 drop into both eyes 2 (two) times daily. , Disp: , Rfl:  ?  latanoprost (XALATAN) 0.005 % ophthalmic solution, Place 1 drop into both eyes at bedtime. , Disp: , Rfl:  ?  lisinopril (PRINIVIL,ZESTRIL) 20 MG tablet, Take 1 tablet by mouth daily., Disp: , Rfl:  ?  omeprazole (PRILOSEC) 20 MG capsule, Take 2 capsules by mouth daily., Disp: , Rfl:  ?  oxyCODONE-acetaminophen (PERCOCET/ROXICET) 5-325 MG tablet, Take 1 tablet by mouth 2 (two) times daily as needed for severe pain. , Disp: , Rfl:  ?  pravastatin (PRAVACHOL) 80 MG tablet, Take 1 tablet by mouth daily., Disp: , Rfl:  ? ?No Known Allergies ? ?I personally reviewed {Reviewed:[id number]} with the patient/caregiver today. ? ? ?ROS ? ?*** ? ?Objective ? ?There were no vitals filed for this visit. ? ?There is no height or weight on file to calculate BMI. ? ?Physical Exam ?*** ? ?No results found for this or any previous visit (from the past 2160 hour(s)). ? ?  Diabetic Foot Exam: ?Diabetic Foot Exam - Simple   ?No data filed ?  ? ?*** ? ?PHQ2/9: ? ?  07/04/2019  ? 10:24 AM 12/11/2018  ?  8:30 AM 11/30/2018  ? 11:57 AM 12/08/2017  ?  1:19 PM 04/25/2017  ?  8:45 AM  ?Depression screen PHQ 2/9  ?Decreased Interest 0 0 0 0 0  ?Down, Depressed, Hopeless 0 0 0 0 0  ?PHQ - 2 Score 0 0 0 0 0  ?Altered sleeping 0   0   ?Tired, decreased energy 0   0   ?Change in appetite 0   0   ?Feeling bad or failure about yourself  0   0   ?Trouble concentrating 0   0   ?Moving slowly or fidgety/restless 0   0   ?Suicidal thoughts 0   0   ?PHQ-9 Score 0   0   ?Difficult doing  work/chores Not difficult at all   Not difficult at all   ?  ?phq 9 is {gen pos [id number]} ?*** ? ?Fall Risk: ? ?  07/04/2019  ? 10:24 AM 12/11/2018  ?  8:30 AM 11/30/2018  ? 11:52 AM 08/31/2018  ?  2:16 PM 12/08/2017  ?  1:19 PM  ?Fall Risk   ?Falls in the past year? 0 0 0 0 No  ?Number falls in past yr: 0 0 0    ?Injury with Fall? 0 0 0    ?Follow up  Falls prevention discussed     ? ?*** ? ? ?Functional Status Survey: ?  ?*** ? ? ?Assessment & Plan ? ?*** ?There are no diagnoses linked to this encounter. ? ?

## 2022-02-01 ENCOUNTER — Ambulatory Visit (INDEPENDENT_AMBULATORY_CARE_PROVIDER_SITE_OTHER): Payer: Medicare Other | Admitting: Physician Assistant

## 2022-02-01 ENCOUNTER — Encounter: Payer: Self-pay | Admitting: Physician Assistant

## 2022-02-01 VITALS — BP 128/72 | HR 62 | Temp 97.9°F | Resp 16 | Ht 68.0 in | Wt 159.0 lb

## 2022-02-01 DIAGNOSIS — M1712 Unilateral primary osteoarthritis, left knee: Secondary | ICD-10-CM | POA: Diagnosis not present

## 2022-02-01 DIAGNOSIS — I10 Essential (primary) hypertension: Secondary | ICD-10-CM | POA: Diagnosis not present

## 2022-02-01 DIAGNOSIS — N1831 Chronic kidney disease, stage 3a: Secondary | ICD-10-CM

## 2022-02-01 DIAGNOSIS — E559 Vitamin D deficiency, unspecified: Secondary | ICD-10-CM | POA: Diagnosis not present

## 2022-02-01 LAB — COMPREHENSIVE METABOLIC PANEL
AG Ratio: 1.9 (calc) (ref 1.0–2.5)
ALT: 12 U/L (ref 9–46)
AST: 11 U/L (ref 10–35)
Albumin: 4 g/dL (ref 3.6–5.1)
Alkaline phosphatase (APISO): 43 U/L (ref 35–144)
BUN/Creatinine Ratio: 12 (calc) (ref 6–22)
BUN: 20 mg/dL (ref 7–25)
CO2: 27 mmol/L (ref 20–32)
Calcium: 9.5 mg/dL (ref 8.6–10.3)
Chloride: 108 mmol/L (ref 98–110)
Creat: 1.69 mg/dL — ABNORMAL HIGH (ref 0.70–1.22)
Globulin: 2.1 g/dL (calc) (ref 1.9–3.7)
Glucose, Bld: 98 mg/dL (ref 65–99)
Potassium: 3.9 mmol/L (ref 3.5–5.3)
Sodium: 141 mmol/L (ref 135–146)
Total Bilirubin: 0.5 mg/dL (ref 0.2–1.2)
Total Protein: 6.1 g/dL (ref 6.1–8.1)

## 2022-02-01 LAB — CBC WITH DIFFERENTIAL/PLATELET
Absolute Monocytes: 533 cells/uL (ref 200–950)
Basophils Absolute: 23 cells/uL (ref 0–200)
Basophils Relative: 0.3 %
Eosinophils Absolute: 188 cells/uL (ref 15–500)
Eosinophils Relative: 2.5 %
HCT: 35.2 % — ABNORMAL LOW (ref 38.5–50.0)
Hemoglobin: 11.7 g/dL — ABNORMAL LOW (ref 13.2–17.1)
Lymphs Abs: 1088 cells/uL (ref 850–3900)
MCH: 32.5 pg (ref 27.0–33.0)
MCHC: 33.2 g/dL (ref 32.0–36.0)
MCV: 97.8 fL (ref 80.0–100.0)
MPV: 10.8 fL (ref 7.5–12.5)
Monocytes Relative: 7.1 %
Neutro Abs: 5670 cells/uL (ref 1500–7800)
Neutrophils Relative %: 75.6 %
Platelets: 141 10*3/uL (ref 140–400)
RBC: 3.6 10*6/uL — ABNORMAL LOW (ref 4.20–5.80)
RDW: 12.4 % (ref 11.0–15.0)
Total Lymphocyte: 14.5 %
WBC: 7.5 10*3/uL (ref 3.8–10.8)

## 2022-02-01 LAB — VITAMIN D 25 HYDROXY (VIT D DEFICIENCY, FRACTURES): Vit D, 25-Hydroxy: 82 ng/mL (ref 30–100)

## 2022-02-01 NOTE — Assessment & Plan Note (Signed)
Chronic, historic condition ?Appears well managed with lisinopril 20 mg po QD ?Continue current medications ?Follow up in 6 months for monitoring ? ?

## 2022-02-01 NOTE — Assessment & Plan Note (Signed)
Chronic, progressing ?Patient reports pain that is 7/10 - relieved with rest, aggravated by walking ?Reports potential compensatory pain in left thigh and calf ?Patient has seen Orthopedics for this is Feb - received steroid injection but this has not provided lasting relief, not eligible for another injection until May ?At this time, since I am unsure of his kidney function, I recommend using Voltaren gel as needed, Tylenol, warm compress to the area, and knee brace to assist with symptoms.  ?[facility] or [facility] for [facility] per his preference.  ?Discussed staying active as allowed and surgical intervention - patient is not amenable to surgery and age may be exclusionary factor at this point ?Follow up as needed for persisting or worsening symptoms.  ?

## 2022-02-01 NOTE — Patient Instructions (Addendum)
Based on your symptoms and physical exam I believe you likely have osteoarthritis in your left knee ?This can create pain and discomfort in a joint such as the knee and when you compensate for it- can create pain in other places ? I recommend the following to help: ? You can use Voltaren gel according to the manufacturers instructions on the knee joint to help with the pain ?Try to stay active as you are able  ?You can use a brace to assist with support ?You can use a heat pad or warm compress to the area as well.  ?I recommend having a follow up every 3 months with someone comfortable doing joint injections - you can have a steroid injection in the joint every 3-4 months to assist with pain ?We will get some lab work today to check your kidney function to see if that will allow other management options ? ?It was nice to meet you and I appreciate the opportunity to be involved in your care ? ?

## 2022-02-01 NOTE — Assessment & Plan Note (Signed)
Chronic, historic condition ?Patient appears to be avoiding nephrotoxic agents ?Recommend he continue to do this, stay well hydrated ?Follow up in 6 months for monitoring ? ?

## 2022-02-04 ENCOUNTER — Telehealth: Payer: Self-pay

## 2022-02-04 NOTE — Telephone Encounter (Signed)
Copy up front  

## 2022-02-04 NOTE — Telephone Encounter (Signed)
Copied from CRM [id number]. Topic: General - Inquiry >> Feb 04, 2022 11:10 AM Brett Gill wrote: Reason for CRM: pt needs a copy of his labs from the other day. He will be by shortly.

## 2023-03-23 NOTE — Progress Notes (Signed)
Name: Brett Gill   MRN: [id number]    DOB: 04/21/1935   Date:03/24/2023       Progress Note  Subjective  Chief Complaint  Sore Toe  HPI  Painful great toe: patient noticed some pain on lateral aspect of left great toe about two weeks ago. He states getting worse and now tender below the toe and is red, no fever, chills or change in appetite   He goes to the [place], unable to see his last labs.   Patient Active Problem List   Diagnosis Date Noted   Primary osteoarthritis of left knee 02/01/2022   Affections of shoulder region 01/31/2022   Central retinal vein occlusion, right eye, stable 01/31/2022   Central retinal vein occlusion, right eye, with macular edema 01/31/2022   Chronic ischemic heart disease 01/31/2022   Encounter for issue of repeat prescription 01/31/2022   Esophageal reflux 01/31/2022   Glaucoma 01/31/2022   Hypertensive heart disease without heart failure 01/31/2022   Lens replaced by other means 01/31/2022   Osteopenia 01/31/2022   Other ill-defined and unknown causes of morbidity and mortality 01/31/2022   Other specified disorders of bone density and structure, unspecified site 01/31/2022   Dorsalgia, unspecified 01/31/2022   Low back pain 01/31/2022   Counseling, unspecified 01/31/2022   Other and unspecified hyperlipidemia 01/31/2022   Essential hypertension 01/31/2022   Hypertension 01/31/2022   Neck pain 01/31/2022   Localized osteoarthritis of shoulder regions, bilateral 01/31/2022   Degenerative joint disease of shoulder region 01/31/2022   Primary osteoarthritis, right shoulder 01/31/2022   Esophageal reflux 10/27/2021   Essential hypertension 10/27/2021   Simple chronic bronchitis ([facility]) 07/04/2019   Unspecified inflammatory spondylopathy, lumbar region ([facility]) 11/30/2018   Thrombocytopenia ([facility]) 11/30/2018   Stage 3 chronic kidney disease ([facility]) 11/30/2018   Chronic shoulder pain (Secondary Area of pain) (Right) 04/25/2017   Lumbar facet syndrome  (Bilateral) (L>R) 04/25/2017   Musculoskeletal pain 04/25/2017   Neurogenic pain 04/25/2017   Rotator cuff arthropathy (Right) 04/25/2017   Osteoarthritis of shoulder (Right) 04/25/2017   Osteoarthritis of AC (acromioclavicular) joint (Right) 04/25/2017   Long term current use of opiate analgesic 03/23/2017   Chronic pain syndrome 03/23/2017   Long term prescription opiate use 03/23/2017   Opiate use 03/23/2017   Chronic low back pain (Primary Area of Pain) (Bilateral) (L>R) 03/23/2017   Vitamin D deficiency 12/31/2015   Hypertension 12/10/2015   Hyperlipidemia 12/10/2015    Past Surgical History:  Procedure Laterality Date   CATARACT EXTRACTION Bilateral 2010    Family History  Problem Relation Age of Onset   Alzheimer's disease Mother    Alzheimer's disease Father    Heart disease Father    Hypertension Sister    Healthy Brother     Social History   Tobacco Use   Smoking status: Former    Packs/day: 0.50    Years: 20.00    Additional pack years: 0.00    Total pack years: 10.00    Types: Cigarettes    Quit date: 1978    Years since quitting: 46.4   Smokeless tobacco: Never  Substance Use Topics   Alcohol use: No    Alcohol/week: 0.0 standard drinks of alcohol     Current Outpatient Medications:    acetaminophen (TYLENOL) 650 MG CR tablet, Take by mouth., Disp: , Rfl:    albuterol (PROVENTIL HFA;VENTOLIN HFA) 108 (90 Base) MCG/ACT inhaler, Inhale 2 puffs into the lungs every 6 (six) hours as needed for wheezing or shortness of  breath., Disp: 1 Inhaler, Rfl: 2   alendronate (FOSAMAX) 70 MG tablet, Take 1 tablet by mouth once a week., Disp: , Rfl:    brimonidine (ALPHAGAN) 0.2 % ophthalmic solution, Place 1 drop into both eyes 3 (three) times daily., Disp: , Rfl:    Cholecalciferol 25 MCG (1000 UT) tablet, Take 1 tablet by mouth daily., Disp: , Rfl:    cyanocobalamin 1000 MCG tablet, Take by mouth., Disp: , Rfl:    dorzolamide-timolol (COSOPT) 22.3-6.8 MG/ML  ophthalmic solution, Place 1 drop into both eyes 2 (two) times daily. , Disp: , Rfl:    lisinopril (ZESTRIL) 20 MG tablet, Take by mouth., Disp: , Rfl:    omeprazole (PRILOSEC) 20 MG capsule, Take 1 capsule by mouth at bedtime., Disp: , Rfl:    oxyCODONE-acetaminophen (PERCOCET/ROXICET) 5-325 MG tablet, Take 1 tablet by mouth 2 (two) times daily as needed for severe pain. , Disp: , Rfl:    pravastatin (PRAVACHOL) 80 MG tablet, Take 1 tablet by mouth daily., Disp: , Rfl:    Calcium Carb-Cholecalciferol (CALCIUM PLUS D3 ABSORBABLE) [id number] MG-UNIT CAPS, Take 1 capsule by mouth daily with breakfast., Disp: 90 capsule, Rfl: 0  Allergies  Allergen Reactions   Etodolac Diarrhea    I personally reviewed active problem list, medication list, allergies, family history, social history, health maintenance with the patient/caregiver today.   ROS  Ten systems reviewed and is negative except as mentioned in HPI   Objective  Vitals:   03/24/23 1503  BP: 116/72  Pulse: 69  Resp: 16  SpO2: 95%  Weight: 136 lb (61.7 kg)  Height: 5\' 7"  (1.702 m)    Body mass index is 21.3 kg/m.  Physical Exam  Constitutional: Patient appears well-developed and well-nourished.  No distress.  HEENT: head atraumatic, normocephalic, pupils equal and reactive to light, neck supple Cardiovascular: Normal rate, regular rhythm and normal heart sounds.  No murmur heard. No BLE edema. Pulmonary/Chest: Effort normal and breath sounds normal. No respiratory distress. Abdominal: Soft.  There is no tenderness. Skin: ingrown toenail, some purulent discharge, redness and painful to touch  Psychiatric: Patient has a normal mood and affect. behavior is normal. Judgment and thought content normal.    PHQ2/9:    03/24/2023    3:03 PM 02/01/2022    8:39 AM 07/04/2019   10:24 AM 12/11/2018    8:30 AM 11/30/2018   11:57 AM  Depression screen PHQ 2/9  Decreased Interest 0 0 0 0 0  Down, Depressed, Hopeless 0 0 0 0 0  PHQ - 2  Score 0 0 0 0 0  Altered sleeping 0 0 0    Tired, decreased energy 0 0 0    Change in appetite 0 0 0    Feeling bad or failure about yourself  0 0 0    Trouble concentrating 0 0 0    Moving slowly or fidgety/restless 0 0 0    Suicidal thoughts 0 0 0    PHQ-9 Score 0 0 0    Difficult doing work/chores   Not difficult at all      phq 9 is negative   Fall Risk:    03/24/2023    3:02 PM 02/01/2022    8:38 AM 07/04/2019   10:24 AM 12/11/2018    8:30 AM 11/30/2018   11:52 AM  Fall Risk   Falls in the past year? 0 0 0 0 0  Number falls in past yr: 0  0 0 0  Injury with  Fall? 0  0 0 0  Risk for fall due to : No Fall Risks Impaired balance/gait     Follow up Falls prevention discussed Falls prevention discussed;Education provided;Falls evaluation completed  Falls prevention discussed       Functional Status Survey: Is the patient deaf or have difficulty hearing?: No Does the patient have difficulty seeing, even when wearing glasses/contacts?: No Does the patient have difficulty concentrating, remembering, or making decisions?: No Does the patient have difficulty walking or climbing stairs?: No Does the patient have difficulty dressing or bathing?: No Does the patient have difficulty doing errands alone such as visiting a doctor's office or shopping?: No    Assessment & Plan  1. Ingrown nail of great toe of left foot  - cephALEXin (KEFLEX) 500 MG capsule; Take 1 capsule (500 mg total) by mouth 4 (four) times daily.  Dispense: 28 capsule; Refill: 0 - Ambulatory referral to Podiatry   Advised him to soak for 10 minutes 4 times a day, take antibiotics with food and contact me if gets worse

## 2023-03-24 ENCOUNTER — Encounter: Payer: Self-pay | Admitting: Family Medicine

## 2023-03-24 ENCOUNTER — Ambulatory Visit (INDEPENDENT_AMBULATORY_CARE_PROVIDER_SITE_OTHER): Payer: 59 | Admitting: Family Medicine

## 2023-03-24 VITALS — BP 116/72 | HR 69 | Resp 16 | Ht 67.0 in | Wt 136.0 lb

## 2023-03-24 DIAGNOSIS — L600 Ingrowing nail: Secondary | ICD-10-CM | POA: Diagnosis not present

## 2023-03-24 MED ORDER — CEPHALEXIN 500 MG PO CAPS: 500.0000 mg | ORAL_CAPSULE | Freq: Four times a day (QID) | ORAL | 0 refills | Status: AC

## 2023-04-03 DIAGNOSIS — H34811 Central retinal vein occlusion, right eye, with macular edema: Secondary | ICD-10-CM | POA: Diagnosis not present

## 2023-04-03 DIAGNOSIS — H401131 Primary open-angle glaucoma, bilateral, mild stage: Secondary | ICD-10-CM | POA: Diagnosis not present

## 2023-04-03 DIAGNOSIS — H3581 Retinal edema: Secondary | ICD-10-CM | POA: Diagnosis not present

## 2023-04-03 DIAGNOSIS — Z961 Presence of intraocular lens: Secondary | ICD-10-CM | POA: Diagnosis not present

## 2023-10-09 DIAGNOSIS — Z961 Presence of intraocular lens: Secondary | ICD-10-CM | POA: Diagnosis not present

## 2023-10-09 DIAGNOSIS — H34811 Central retinal vein occlusion, right eye, with macular edema: Secondary | ICD-10-CM | POA: Diagnosis not present

## 2023-10-09 DIAGNOSIS — H3581 Retinal edema: Secondary | ICD-10-CM | POA: Diagnosis not present

## 2023-10-09 DIAGNOSIS — H401131 Primary open-angle glaucoma, bilateral, mild stage: Secondary | ICD-10-CM | POA: Diagnosis not present

## 2024-03-22 ENCOUNTER — Telehealth: Payer: Self-pay

## 2024-03-22 NOTE — Telephone Encounter (Signed)
 Copied from CRM [id number]. Topic: General - Other >> Mar 22, 2024  8:26 AM Emylou G wrote: Reason for CRM: patient called would like to know his blood type if we have?

## 2024-03-22 NOTE — Telephone Encounter (Signed)
 Spoke to patient , advised we do not have records of that specific lab that determines his blood type. [facility]. I advised he can also do blood donations and can request that with them. Pt verbalized understanding
